# Patient Record
Sex: Male | Born: 1950
Health system: Southern US, Community
[De-identification: ages and names within clinical notes are randomized; demographics above are authoritative.]

## PROBLEM LIST (undated history)

## (undated) DIAGNOSIS — I10 Essential (primary) hypertension: Secondary | ICD-10-CM

---

## 2008-08-09 DIAGNOSIS — I10 Essential (primary) hypertension: Secondary | ICD-10-CM | POA: Insufficient documentation

## 2011-12-04 DEATH — deceased

## 2013-10-04 ENCOUNTER — Encounter (HOSPITAL_COMMUNITY): Payer: Self-pay | Admitting: Emergency Medicine

## 2013-10-04 ENCOUNTER — Emergency Department (HOSPITAL_COMMUNITY)
Admission: EM | Admit: 2013-10-04 | Discharge: 2013-10-04 | Disposition: A | Discharge status: Home / Self Care | Payer: Self-pay | Attending: Emergency Medicine | Admitting: Emergency Medicine

## 2013-10-04 DIAGNOSIS — H6120 Impacted cerumen, unspecified ear: Secondary | ICD-10-CM | POA: Insufficient documentation

## 2013-10-04 NOTE — ED Notes (Signed)
Pt reports having ear pain/popping x 3-4 weeks. Started in left ear and is now in both ears and difficulty hearing.

## 2013-10-04 NOTE — ED Provider Notes (Signed)
Medical screening examination/treatment/procedure(s) were performed by non-physician practitioner and as supervising physician I was immediately available for consultation/collaboration.   EKG Interpretation None       Orlie Dakin, MD 10/04/13 1600

## 2013-10-04 NOTE — Discharge Instructions (Signed)
Follow-up with your primary care physician-- may need to have ears washed every few months to prevent excess wax build up. Return to the ED for new or worsening symptoms.

## 2013-10-04 NOTE — ED Provider Notes (Signed)
CSN: [id number]     Arrival date & time 10/04/13  1253 History  This chart was scribed for non-physician practitioner Quincy Carnes, PA-C working with Orlie Dakin, MD by Ludger Nutting, ED Scribe. This patient was seen in room TR04C/TR04C and the patient's care was started at 2:30 PM.     Chief Complaint  Patient presents with  . Otalgia      The history is provided by the patient. No language interpreter was used.    HPI Comments: Billy Ramirez is a 63 y.o. male who presents to the Emergency Department complaining of 8 days of gradual onset, constant, gradually worsening, bilateral otalgia. He states the pain initially began on the left and progressed to both sides. He reports associated left temple pain that occurs when sleeping on that side. He also has associated muffled hearing. He denies any other symptoms at this time.  No fevers or chills.  No hearing problems at baseline.  History reviewed. No pertinent past medical history. History reviewed. No pertinent past surgical history. History reviewed. No pertinent family history. History  Substance Use Topics  . Smoking status: Not on file  . Smokeless tobacco: Not on file  . Alcohol Use: No    Review of Systems  HENT: Positive for ear pain. Negative for hearing loss.        +muffled hearing  All other systems reviewed and are negative.     Allergies  Review of patient's allergies indicates no known allergies.  Home Medications   Prior to Admission medications   Not on File   BP 145/89  Pulse 93  Temp(Src) 98 F (36.7 C) (Oral)  Resp 18  Wt 206 lb (93.441 kg)  SpO2 96%  Physical Exam  Nursing note and vitals reviewed. Constitutional: He is oriented to person, place, and time. He appears well-developed and well-nourished. No distress.  HENT:  Head: Normocephalic and atraumatic.  Mouth/Throat: Oropharynx is clear and moist.  Bilateral cerumen impaction.   Eyes: Conjunctivae and EOM are normal. Pupils are  equal, round, and reactive to light.  Neck: Normal range of motion. Neck supple.  Cardiovascular: Normal rate, regular rhythm and normal heart sounds.   Pulmonary/Chest: Effort normal and breath sounds normal.  Musculoskeletal: Normal range of motion.  Neurological: He is alert and oriented to person, place, and time.  Skin: Skin is warm and dry. He is not diaphoretic.  Psychiatric: He has a normal mood and affect.    ED Course  EAR CERUMEN REMOVAL Date/Time: 10/04/2013 3:04 PM Performed by: Larene Pickett Authorized by: Larene Pickett Consent: Verbal consent obtained. Risks and benefits: risks, benefits and alternatives were discussed Consent given by: patient Patient understanding: patient states understanding of the procedure being performed Patient identity confirmed: verbally with patient Local anesthetic: none Location details: right ear Procedure type: curette Patient sedated: no Patient tolerance: Patient tolerated the procedure well with no immediate complications.  EAR CERUMEN REMOVAL Date/Time: 10/04/2013 3:05 PM Performed by: Larene Pickett Authorized by: Larene Pickett Consent: Verbal consent obtained. Risks and benefits: risks, benefits and alternatives were discussed Consent given by: patient Patient understanding: patient states understanding of the procedure being performed Patient identity confirmed: verbally with patient Local anesthetic: none Location details: left ear Procedure type: curette Patient tolerance: Patient tolerated the procedure well with no immediate complications.   (including critical care time)  DIAGNOSTIC STUDIES: Oxygen Saturation is 96% on RA, adequate by my interpretation.    COORDINATION OF CARE: 2:31 PM Discussed  treatment plan with pt at bedside and pt agreed to plan.   Labs Review Labs Reviewed - No data to display  Imaging Review No results found.   EKG Interpretation None      MDM   Final diagnoses:  Cerumen  impaction   Bilateral cerumen impaction which was removed in the ED by myself.  TMs were clearly visualized and were without perforation or signs of infection.  Hearing was improved. Pt will FU with PCP if problems occur.  Discussed plan with patient, he/she acknowledged understanding and agreed with plan of care.  Return precautions given for new or worsening symptoms.  I personally performed the services described in this documentation, which was scribed in my presence. The recorded information has been reviewed and is accurate.  Larene Pickett, PA-C 10/04/13 1506

## 2015-04-16 DIAGNOSIS — R030 Elevated blood-pressure reading, without diagnosis of hypertension: Secondary | ICD-10-CM | POA: Diagnosis not present

## 2015-04-16 DIAGNOSIS — J309 Allergic rhinitis, unspecified: Secondary | ICD-10-CM | POA: Diagnosis not present

## 2015-04-16 DIAGNOSIS — Z23 Encounter for immunization: Secondary | ICD-10-CM | POA: Diagnosis not present

## 2015-04-16 DIAGNOSIS — L409 Psoriasis, unspecified: Secondary | ICD-10-CM | POA: Diagnosis not present

## 2015-06-28 DIAGNOSIS — H5210 Myopia, unspecified eye: Secondary | ICD-10-CM | POA: Diagnosis not present

## 2015-06-28 DIAGNOSIS — H52203 Unspecified astigmatism, bilateral: Secondary | ICD-10-CM | POA: Diagnosis not present

## 2015-07-19 DIAGNOSIS — I10 Essential (primary) hypertension: Secondary | ICD-10-CM | POA: Diagnosis not present

## 2015-07-19 DIAGNOSIS — N529 Male erectile dysfunction, unspecified: Secondary | ICD-10-CM | POA: Diagnosis not present

## 2015-07-19 DIAGNOSIS — J309 Allergic rhinitis, unspecified: Secondary | ICD-10-CM | POA: Diagnosis not present

## 2015-07-19 DIAGNOSIS — Z0000 Encounter for general adult medical examination without abnormal findings: Secondary | ICD-10-CM | POA: Diagnosis not present

## 2015-07-19 DIAGNOSIS — L409 Psoriasis, unspecified: Secondary | ICD-10-CM | POA: Diagnosis not present

## 2015-07-19 DIAGNOSIS — F17201 Nicotine dependence, unspecified, in remission: Secondary | ICD-10-CM | POA: Diagnosis not present

## 2015-07-19 DIAGNOSIS — Z125 Encounter for screening for malignant neoplasm of prostate: Secondary | ICD-10-CM | POA: Diagnosis not present

## 2015-07-20 ENCOUNTER — Other Ambulatory Visit: Payer: Self-pay | Admitting: Family Medicine

## 2015-07-20 DIAGNOSIS — F17201 Nicotine dependence, unspecified, in remission: Secondary | ICD-10-CM

## 2015-07-30 ENCOUNTER — Ambulatory Visit: Payer: Self-pay

## 2015-08-02 ENCOUNTER — Ambulatory Visit
Admission: RE | Admit: 2015-08-02 | Discharge: 2015-08-02 | Disposition: A | Discharge status: Home / Self Care | Payer: Commercial Managed Care - HMO | Source: Ambulatory Visit | Attending: Family Medicine | Admitting: Family Medicine

## 2015-08-02 DIAGNOSIS — F17201 Nicotine dependence, unspecified, in remission: Secondary | ICD-10-CM

## 2015-08-02 DIAGNOSIS — Z136 Encounter for screening for cardiovascular disorders: Secondary | ICD-10-CM | POA: Diagnosis not present

## 2015-08-06 DIAGNOSIS — I10 Essential (primary) hypertension: Secondary | ICD-10-CM | POA: Diagnosis not present

## 2015-08-06 DIAGNOSIS — N529 Male erectile dysfunction, unspecified: Secondary | ICD-10-CM | POA: Diagnosis not present

## 2015-09-21 DIAGNOSIS — Z1211 Encounter for screening for malignant neoplasm of colon: Secondary | ICD-10-CM | POA: Diagnosis not present

## 2015-09-21 DIAGNOSIS — D126 Benign neoplasm of colon, unspecified: Secondary | ICD-10-CM | POA: Diagnosis not present

## 2015-09-21 DIAGNOSIS — D125 Benign neoplasm of sigmoid colon: Secondary | ICD-10-CM | POA: Diagnosis not present

## 2015-09-21 DIAGNOSIS — K621 Rectal polyp: Secondary | ICD-10-CM | POA: Diagnosis not present

## 2015-12-14 DIAGNOSIS — I10 Essential (primary) hypertension: Secondary | ICD-10-CM | POA: Diagnosis not present

## 2015-12-14 DIAGNOSIS — H6120 Impacted cerumen, unspecified ear: Secondary | ICD-10-CM | POA: Diagnosis not present

## 2015-12-14 DIAGNOSIS — J309 Allergic rhinitis, unspecified: Secondary | ICD-10-CM | POA: Diagnosis not present

## 2016-06-30 DIAGNOSIS — H52203 Unspecified astigmatism, bilateral: Secondary | ICD-10-CM | POA: Diagnosis not present

## 2016-07-24 DIAGNOSIS — I10 Essential (primary) hypertension: Secondary | ICD-10-CM | POA: Diagnosis not present

## 2016-07-24 DIAGNOSIS — Z0000 Encounter for general adult medical examination without abnormal findings: Secondary | ICD-10-CM | POA: Diagnosis not present

## 2016-07-24 DIAGNOSIS — Z125 Encounter for screening for malignant neoplasm of prostate: Secondary | ICD-10-CM | POA: Diagnosis not present

## 2016-07-24 DIAGNOSIS — L409 Psoriasis, unspecified: Secondary | ICD-10-CM | POA: Diagnosis not present

## 2016-07-24 DIAGNOSIS — Z23 Encounter for immunization: Secondary | ICD-10-CM | POA: Diagnosis not present

## 2016-07-24 DIAGNOSIS — J309 Allergic rhinitis, unspecified: Secondary | ICD-10-CM | POA: Diagnosis not present

## 2016-07-24 DIAGNOSIS — N400 Enlarged prostate without lower urinary tract symptoms: Secondary | ICD-10-CM | POA: Diagnosis not present

## 2016-10-10 DIAGNOSIS — I10 Essential (primary) hypertension: Secondary | ICD-10-CM | POA: Diagnosis not present

## 2017-02-06 DIAGNOSIS — M25579 Pain in unspecified ankle and joints of unspecified foot: Secondary | ICD-10-CM | POA: Diagnosis not present

## 2017-02-06 DIAGNOSIS — Z23 Encounter for immunization: Secondary | ICD-10-CM | POA: Diagnosis not present

## 2017-04-10 DIAGNOSIS — I10 Essential (primary) hypertension: Secondary | ICD-10-CM | POA: Diagnosis not present

## 2017-04-10 DIAGNOSIS — G479 Sleep disorder, unspecified: Secondary | ICD-10-CM | POA: Diagnosis not present

## 2017-04-10 DIAGNOSIS — M109 Gout, unspecified: Secondary | ICD-10-CM | POA: Diagnosis not present

## 2017-05-26 DIAGNOSIS — M549 Dorsalgia, unspecified: Secondary | ICD-10-CM | POA: Diagnosis not present

## 2017-07-10 DIAGNOSIS — H5203 Hypermetropia, bilateral: Secondary | ICD-10-CM | POA: Diagnosis not present

## 2017-10-09 DIAGNOSIS — Z125 Encounter for screening for malignant neoplasm of prostate: Secondary | ICD-10-CM | POA: Diagnosis not present

## 2017-10-09 DIAGNOSIS — J309 Allergic rhinitis, unspecified: Secondary | ICD-10-CM | POA: Diagnosis not present

## 2017-10-09 DIAGNOSIS — L409 Psoriasis, unspecified: Secondary | ICD-10-CM | POA: Diagnosis not present

## 2017-10-09 DIAGNOSIS — I10 Essential (primary) hypertension: Secondary | ICD-10-CM | POA: Diagnosis not present

## 2017-10-09 DIAGNOSIS — Z0000 Encounter for general adult medical examination without abnormal findings: Secondary | ICD-10-CM | POA: Diagnosis not present

## 2017-10-09 DIAGNOSIS — Z1159 Encounter for screening for other viral diseases: Secondary | ICD-10-CM | POA: Diagnosis not present

## 2017-10-09 DIAGNOSIS — N400 Enlarged prostate without lower urinary tract symptoms: Secondary | ICD-10-CM | POA: Diagnosis not present

## 2017-10-09 DIAGNOSIS — Z1389 Encounter for screening for other disorder: Secondary | ICD-10-CM | POA: Diagnosis not present

## 2017-10-23 ENCOUNTER — Emergency Department (HOSPITAL_COMMUNITY)
Admission: EM | Admit: 2017-10-23 | Discharge: 2017-10-24 | Disposition: A | Discharge status: Home / Self Care | Payer: Medicare HMO | Attending: Emergency Medicine | Admitting: Emergency Medicine

## 2017-10-23 ENCOUNTER — Other Ambulatory Visit: Payer: Self-pay

## 2017-10-23 ENCOUNTER — Encounter (HOSPITAL_COMMUNITY): Payer: Self-pay

## 2017-10-23 DIAGNOSIS — I10 Essential (primary) hypertension: Secondary | ICD-10-CM

## 2017-10-23 DIAGNOSIS — R5383 Other fatigue: Secondary | ICD-10-CM | POA: Insufficient documentation

## 2017-10-23 DIAGNOSIS — R531 Weakness: Secondary | ICD-10-CM | POA: Diagnosis not present

## 2017-10-23 DIAGNOSIS — R42 Dizziness and giddiness: Secondary | ICD-10-CM | POA: Diagnosis not present

## 2017-10-23 HISTORY — DX: Essential (primary) hypertension: I10

## 2017-10-23 NOTE — ED Provider Notes (Signed)
[facility] EMERGENCY DEPARTMENT Provider Note   CSN: [id number] Arrival date & time: 10/23/17  2028     History   Chief Complaint Chief Complaint  Patient presents with  . Hypertension    HPI Billy Ramirez is a 67 y.o. male.  Patient with a history of HTN presents with concern for elevated blood pressure today. He reports he has not been feeling well all day today, describing mild persistent nausea, generalized fatigue, "wanting to sleep all day". No chest pain, SOB, DOE, diaphoresis. No history of CAD, CHF. He went to [facility] to check his blood pressure and found it to be significantly elevated prompting ED evaluation. He states he is compliant with his blood pressure medication and takes them once daily in the mornings.  He is feeling better but still a little weak.   The history is provided by the patient and the spouse. No language interpreter was used.  Hypertension  Pertinent negatives include no chest pain, no abdominal pain and no shortness of breath.    Past Medical History:  Diagnosis Date  . Hypertension     There are no active problems to display for this patient.   History reviewed. No pertinent surgical history.      Home Medications    Prior to Admission medications   Not on File    Family History History reviewed. No pertinent family history.  Social History Social History   Tobacco Use  . Smoking status: Never Smoker  Substance Use Topics  . Alcohol use: Yes    Comment: rare  . Drug use: No     Allergies   Patient has no known allergies.   Review of Systems Review of Systems  Constitutional: Positive for fatigue. Negative for chills, diaphoresis and fever.  HENT: Negative.   Respiratory: Negative.  Negative for cough and shortness of breath.   Cardiovascular: Negative.  Negative for chest pain and leg swelling.  Gastrointestinal: Positive for nausea. Negative for abdominal pain and vomiting.  Genitourinary:  Negative.   Musculoskeletal: Negative.   Skin: Negative.   Neurological: Positive for weakness and light-headedness.     Physical Exam Updated Vital Signs BP (!) 172/91 (BP Location: Right Arm)   Pulse 66   Temp 98 F (36.7 C) (Oral)   Resp 16   Ht 6\' 2"  (1.88 m)   Wt 79.4 kg (175 lb)   SpO2 99%   BMI 22.47 kg/m   Physical Exam  Constitutional: He appears well-developed and well-nourished.  HENT:  Head: Normocephalic.  Neck: Normal range of motion. Neck supple. Carotid bruit is not present.  Cardiovascular: Normal rate and regular rhythm.  No murmur heard. Pulmonary/Chest: Effort normal and breath sounds normal. He has no wheezes. He has no rales. He exhibits no tenderness.  Abdominal: Soft. Bowel sounds are normal. There is no tenderness. There is no rebound and no guarding.  Musculoskeletal: Normal range of motion. He exhibits no edema.  Neurological: He is alert. No cranial nerve deficit.  Skin: Skin is warm and dry. No rash noted.  Psychiatric: He has a normal mood and affect.     ED Treatments / Results  Labs (all labs ordered are listed, but only abnormal results are displayed) Labs Reviewed  I-STAT CHEM 8, ED - Abnormal; Notable for the following components:      Result Value   Glucose, Bld 121 (*)    All other components within normal limits  CBC WITH DIFFERENTIAL/PLATELET  I-STAT TROPONIN, ED  EKG None  Radiology No results found.  Procedures Procedures (including critical care time)  Medications Ordered in ED Medications - No data to display   Initial Impression / Assessment and Plan / ED Course  I have reviewed the triage vital signs and the nursing notes.  Pertinent labs & imaging results that were available during my care of the patient were reviewed by me and considered in my medical decision making (see chart for details).     Patient presents with concern for elevated blood pressure, found yesterday after feeling unwell and  fatigued all day, with mild nausea. No CP, SOB at any time. No cough or fever. He reports "having an issue with sinuses" but denies congestion or sore throat. He has not been taking OTC cold remedies.   Labs, EKG are normal. He is ambulatory without symptoms. Blood pressure was better than patient reported until time of discharge and then found to be elevated to 203/120. Still asymptomatic.   Discussed with dr. Jeneen Rinks who advises Hydralazine 10 mg IV, after which the blood pressure improves significantly. He is felt stable for discharge home.   Final Clinical Impressions(s) / ED Diagnoses   Final diagnoses:  None   1. Hypertension   ED Discharge Orders    None       Dennie Bible 10/24/17 0725    Tanna Furry, MD 10/30/17 [phone]

## 2017-10-23 NOTE — ED Triage Notes (Addendum)
Pt endorses check BP earlier and it was 206/113 at the pharmacy. Pt states he had some slight nausea, denies CP or shob. No visual changes. 172/91 in triage. Is on BP medications and took them this morning.

## 2017-10-24 LAB — I-STAT CHEM 8, ED
BUN: 13 mg/dL (ref 6–20)
CALCIUM ION: 1.23 mmol/L (ref 1.15–1.40)
Chloride: 104 mmol/L (ref 101–111)
Creatinine, Ser: 1.2 mg/dL (ref 0.61–1.24)
Glucose, Bld: 121 mg/dL — ABNORMAL HIGH (ref 65–99)
HEMATOCRIT: 43 % (ref 39.0–52.0)
Hemoglobin: 14.6 g/dL (ref 13.0–17.0)
Potassium: 4.3 mmol/L (ref 3.5–5.1)
Sodium: 141 mmol/L (ref 135–145)
TCO2: 25 mmol/L (ref 22–32)

## 2017-10-24 LAB — CBC WITH DIFFERENTIAL/PLATELET
ABS IMMATURE GRANULOCYTES: 0 10*3/uL (ref 0.0–0.1)
Basophils Absolute: 0 10*3/uL (ref 0.0–0.1)
Basophils Relative: 1 %
Eosinophils Absolute: 0.2 10*3/uL (ref 0.0–0.7)
Eosinophils Relative: 3 %
HCT: 43.2 % (ref 39.0–52.0)
Hemoglobin: 13.6 g/dL (ref 13.0–17.0)
IMMATURE GRANULOCYTES: 0 %
Lymphocytes Relative: 38 %
Lymphs Abs: 2.4 10*3/uL (ref 0.7–4.0)
MCH: 29.4 pg (ref 26.0–34.0)
MCHC: 31.5 g/dL (ref 30.0–36.0)
MCV: 93.3 fL (ref 78.0–100.0)
MONOS PCT: 9 %
Monocytes Absolute: 0.6 10*3/uL (ref 0.1–1.0)
NEUTROS ABS: 3.1 10*3/uL (ref 1.7–7.7)
NEUTROS PCT: 49 %
Platelets: 234 10*3/uL (ref 150–400)
RBC: 4.63 MIL/uL (ref 4.22–5.81)
RDW: 12.6 % (ref 11.5–15.5)
WBC: 6.3 10*3/uL (ref 4.0–10.5)

## 2017-10-24 LAB — I-STAT TROPONIN, ED: Troponin i, poc: 0 ng/mL (ref 0.00–0.08)

## 2017-10-24 MED ORDER — HYDRALAZINE HCL 20 MG/ML IJ SOLN
10.0000 mg | Freq: Once | INTRAMUSCULAR | Status: AC
  Administered 2017-10-24: 10 mg via INTRAVENOUS
  Filled 2017-10-24: qty 1

## 2017-10-24 MED ORDER — HYDRALAZINE HCL 20 MG/ML IJ SOLN: 10.0000 mg | Freq: Once | INTRAMUSCULAR | Status: DC

## 2017-10-24 NOTE — ED Notes (Signed)
Pt ambulated around nurses station with steady gait, no complaints

## 2017-10-26 DIAGNOSIS — I10 Essential (primary) hypertension: Secondary | ICD-10-CM | POA: Diagnosis not present

## 2017-12-04 DIAGNOSIS — J069 Acute upper respiratory infection, unspecified: Secondary | ICD-10-CM | POA: Diagnosis not present

## 2017-12-25 DIAGNOSIS — R05 Cough: Secondary | ICD-10-CM | POA: Diagnosis not present

## 2017-12-25 DIAGNOSIS — J069 Acute upper respiratory infection, unspecified: Secondary | ICD-10-CM | POA: Diagnosis not present

## 2018-04-30 DIAGNOSIS — I10 Essential (primary) hypertension: Secondary | ICD-10-CM | POA: Diagnosis not present

## 2018-04-30 DIAGNOSIS — L309 Dermatitis, unspecified: Secondary | ICD-10-CM | POA: Diagnosis not present

## 2018-05-17 DIAGNOSIS — M545 Low back pain: Secondary | ICD-10-CM | POA: Diagnosis not present

## 2018-07-09 DIAGNOSIS — M549 Dorsalgia, unspecified: Secondary | ICD-10-CM | POA: Diagnosis not present

## 2018-07-22 DIAGNOSIS — M6281 Muscle weakness (generalized): Secondary | ICD-10-CM | POA: Diagnosis not present

## 2018-07-22 DIAGNOSIS — M545 Low back pain: Secondary | ICD-10-CM | POA: Diagnosis not present

## 2019-02-25 DIAGNOSIS — Z1389 Encounter for screening for other disorder: Secondary | ICD-10-CM | POA: Diagnosis not present

## 2019-02-25 DIAGNOSIS — I10 Essential (primary) hypertension: Secondary | ICD-10-CM | POA: Diagnosis not present

## 2019-02-25 DIAGNOSIS — M109 Gout, unspecified: Secondary | ICD-10-CM | POA: Diagnosis not present

## 2019-02-25 DIAGNOSIS — G479 Sleep disorder, unspecified: Secondary | ICD-10-CM | POA: Diagnosis not present

## 2019-02-25 DIAGNOSIS — N529 Male erectile dysfunction, unspecified: Secondary | ICD-10-CM | POA: Diagnosis not present

## 2019-02-25 DIAGNOSIS — N400 Enlarged prostate without lower urinary tract symptoms: Secondary | ICD-10-CM | POA: Diagnosis not present

## 2019-02-25 DIAGNOSIS — Z0000 Encounter for general adult medical examination without abnormal findings: Secondary | ICD-10-CM | POA: Diagnosis not present

## 2019-02-25 DIAGNOSIS — Z125 Encounter for screening for malignant neoplasm of prostate: Secondary | ICD-10-CM | POA: Diagnosis not present

## 2019-02-25 DIAGNOSIS — J309 Allergic rhinitis, unspecified: Secondary | ICD-10-CM | POA: Diagnosis not present

## 2019-09-02 DIAGNOSIS — H6120 Impacted cerumen, unspecified ear: Secondary | ICD-10-CM | POA: Diagnosis not present

## 2019-09-02 DIAGNOSIS — N529 Male erectile dysfunction, unspecified: Secondary | ICD-10-CM | POA: Diagnosis not present

## 2019-09-02 DIAGNOSIS — I10 Essential (primary) hypertension: Secondary | ICD-10-CM | POA: Diagnosis not present

## 2019-09-02 DIAGNOSIS — E7800 Pure hypercholesterolemia, unspecified: Secondary | ICD-10-CM | POA: Diagnosis not present

## 2019-09-02 DIAGNOSIS — J309 Allergic rhinitis, unspecified: Secondary | ICD-10-CM | POA: Diagnosis not present

## 2019-10-22 ENCOUNTER — Emergency Department (HOSPITAL_COMMUNITY): Payer: Medicare PPO

## 2019-10-22 ENCOUNTER — Other Ambulatory Visit: Payer: Self-pay

## 2019-10-22 ENCOUNTER — Encounter (HOSPITAL_COMMUNITY): Payer: Self-pay | Admitting: Emergency Medicine

## 2019-10-22 ENCOUNTER — Emergency Department (HOSPITAL_COMMUNITY)
Admission: EM | Admit: 2019-10-22 | Discharge: 2019-10-22 | Disposition: A | Discharge status: Home / Self Care | Payer: Medicare PPO | Attending: Emergency Medicine | Admitting: Emergency Medicine

## 2019-10-22 DIAGNOSIS — R42 Dizziness and giddiness: Secondary | ICD-10-CM | POA: Diagnosis not present

## 2019-10-22 DIAGNOSIS — J189 Pneumonia, unspecified organism: Secondary | ICD-10-CM | POA: Diagnosis not present

## 2019-10-22 DIAGNOSIS — I10 Essential (primary) hypertension: Secondary | ICD-10-CM | POA: Insufficient documentation

## 2019-10-22 DIAGNOSIS — R0602 Shortness of breath: Secondary | ICD-10-CM | POA: Diagnosis not present

## 2019-10-22 DIAGNOSIS — Z79899 Other long term (current) drug therapy: Secondary | ICD-10-CM | POA: Insufficient documentation

## 2019-10-22 DIAGNOSIS — J90 Pleural effusion, not elsewhere classified: Secondary | ICD-10-CM | POA: Diagnosis not present

## 2019-10-22 DIAGNOSIS — I517 Cardiomegaly: Secondary | ICD-10-CM | POA: Diagnosis not present

## 2019-10-22 LAB — URINALYSIS, ROUTINE W REFLEX MICROSCOPIC
Bilirubin Urine: NEGATIVE
Glucose, UA: NEGATIVE mg/dL
Hgb urine dipstick: NEGATIVE
Ketones, ur: NEGATIVE mg/dL
Leukocytes,Ua: NEGATIVE
Nitrite: NEGATIVE
Protein, ur: NEGATIVE mg/dL
Specific Gravity, Urine: 1.014 (ref 1.005–1.030)
pH: 7 (ref 5.0–8.0)

## 2019-10-22 LAB — CBC
HCT: 43.8 % (ref 39.0–52.0)
Hemoglobin: 13.8 g/dL (ref 13.0–17.0)
MCH: 29.7 pg (ref 26.0–34.0)
MCHC: 31.5 g/dL (ref 30.0–36.0)
MCV: 94.4 fL (ref 80.0–100.0)
Platelets: 230 10*3/uL (ref 150–400)
RBC: 4.64 MIL/uL (ref 4.22–5.81)
RDW: 12.8 % (ref 11.5–15.5)
WBC: 6.2 10*3/uL (ref 4.0–10.5)
nRBC: 0 % (ref 0.0–0.2)

## 2019-10-22 LAB — CBG MONITORING, ED: Glucose-Capillary: 136 mg/dL — ABNORMAL HIGH (ref 70–99)

## 2019-10-22 LAB — BASIC METABOLIC PANEL
Anion gap: 6 (ref 5–15)
BUN: 11 mg/dL (ref 8–23)
CO2: 23 mmol/L (ref 22–32)
Calcium: 9 mg/dL (ref 8.9–10.3)
Chloride: 109 mmol/L (ref 98–111)
Creatinine, Ser: 1.06 mg/dL (ref 0.61–1.24)
GFR calc Af Amer: >60 mL/min (ref >60)
GFR calc non Af Amer: >60 mL/min (ref >60)
Glucose, Bld: 159 mg/dL — ABNORMAL HIGH (ref 70–99)
Potassium: 4.1 mmol/L (ref 3.5–5.1)
Sodium: 138 mmol/L (ref 135–145)

## 2019-10-22 LAB — BRAIN NATRIURETIC PEPTIDE: B Natriuretic Peptide: 26.2 pg/mL (ref 0.0–100.0)

## 2019-10-22 LAB — TROPONIN I (HIGH SENSITIVITY)
Troponin I (High Sensitivity): 10 ng/L (ref <18)
Troponin I (High Sensitivity): 4 ng/L (ref <18)

## 2019-10-22 LAB — D-DIMER, QUANTITATIVE: D-Dimer, Quant: 0.78 ug/mL-FEU — ABNORMAL HIGH (ref 0.00–0.50)

## 2019-10-22 MED ORDER — DOXYCYCLINE HYCLATE 100 MG PO CAPS
100.0000 mg | ORAL_CAPSULE | Freq: Two times a day (BID) | ORAL | 0 refills | Status: AC
Start: 2019-10-22 — End: 2019-10-29

## 2019-10-22 MED ORDER — SODIUM CHLORIDE 0.9% FLUSH
3.0000 mL | Freq: Once | INTRAVENOUS | Status: AC
  Administered 2019-10-22: 3 mL via INTRAVENOUS

## 2019-10-22 MED ORDER — IOHEXOL 350 MG/ML SOLN
75.0000 mL | Freq: Once | INTRAVENOUS | Status: AC | PRN
  Administered 2019-10-22: 75 mL via INTRAVENOUS

## 2019-10-22 NOTE — ED Triage Notes (Signed)
Pt. Stated, I started having some dizziness or light headed yesterday, today more dizzy.

## 2019-10-22 NOTE — ED Provider Notes (Signed)
[facility] EMERGENCY DEPARTMENT Provider Note   CSN: [id number] Arrival date & time: 10/22/19  8127     History Chief Complaint  Patient presents with  . Dizziness  . Shortness of Breath    Billy Ramirez is a 69 y.o. male with pertinent past medical history of hypertension that presents emergency department today for dizziness and shortness of breath.  Patient states that he felt slightly dizzy yesterday, describes it as a woozy feeling as if he was hung over and then the dizziness progressed and went away, states that it only lasted about half an hour.  States that today it became extremely worse, states that it was severe and immediately started this morning.  States that it still feels like he is hung over, states that it is  worse when he is walking and when he is laying down.  Admits it to feeling like lightheadedness, no  room is spinning.  Denies any headache or vision changes.  Denies any nausea or vomiting.  Also admits to shortness of breath that started this morning when the dizziness started.  States that the shortness of breath is only exertional.  Has never felt anything like this before.  Denies any cardiac or lung history.  Does not smoke.  Denies any recent long travel, recent surgery, clotting disorder, calf tenderness or swelling.  Patient states that he was in normal health before this started.  States that he has hypertension as his only medical diagnosis.  States that he is pretty active and gets a lot of exercise at work.  Also admits to some right hand numbness and tingling in his fingers, states that this also occurred when the dizziness started yesterday.  Denies any chest pain.  Denies any back pain.  Denies any URI-like symptoms.  No fever or chills.  HPI     Past Medical History:  Diagnosis Date  . Hypertension     There are no problems to display for this patient.   History reviewed. No pertinent surgical history.     No family  history on file.  Social History   Tobacco Use  . Smoking status: Never Smoker  . Smokeless tobacco: Never Used  Substance Use Topics  . Alcohol use: Yes    Comment: rare  . Drug use: No    Home Medications Prior to Admission medications   Medication Sig Start Date End Date Taking? Authorizing Provider  atorvastatin (LIPITOR) 20 MG tablet Take 20 mg by mouth daily.   Yes [provider]  lisinopril (ZESTRIL) 20 MG tablet Take 20 mg by mouth daily.   Yes [provider]  doxycycline (VIBRAMYCIN) 100 MG capsule Take 1 capsule (100 mg total) by mouth 2 (two) times daily for 7 days. 10/22/19 10/29/19  Alfredia Client, PA-C    Allergies    Patient has no known allergies.  Review of Systems   Review of Systems  Constitutional: Negative for chills, diaphoresis, fatigue and fever.  HENT: Negative for congestion, sore throat and trouble swallowing.   Eyes: Negative for pain and visual disturbance.  Respiratory: Positive for shortness of breath. Negative for apnea, cough, choking, chest tightness and wheezing.   Cardiovascular: Negative for chest pain, palpitations and leg swelling.  Gastrointestinal: Negative for abdominal distention, abdominal pain, diarrhea, nausea and vomiting.  Genitourinary: Negative for difficulty urinating.  Musculoskeletal: Negative for back pain, neck pain and neck stiffness.  Skin: Negative for pallor.  Neurological: Positive for dizziness and numbness (Right fingers). Negative  for tremors, seizures, syncope, facial asymmetry, speech difficulty, weakness, light-headedness and headaches.  Psychiatric/Behavioral: Negative for confusion.    Physical Exam Updated Vital Signs BP 138/90 (BP Location: Left Arm)   Pulse (!) 118   Temp 98.2 F (36.8 C) (Oral)   Resp 16   Ht 6\' 2"  (1.88 m)   Wt 124.7 kg   SpO2 100%   BMI 35.31 kg/m   Physical Exam Constitutional:      General: He is not in acute distress.    Appearance: Normal appearance.  He is not ill-appearing, toxic-appearing or diaphoretic.  HENT:     Mouth/Throat:     Mouth: Mucous membranes are moist.     Pharynx: Oropharynx is clear.  Eyes:     General: No scleral icterus.    Extraocular Movements: Extraocular movements intact.     Pupils: Pupils are equal, round, and reactive to light.  Cardiovascular:     Rate and Rhythm: Normal rate and regular rhythm.     Pulses: Normal pulses.     Heart sounds: Normal heart sounds.  Pulmonary:     Effort: Pulmonary effort is normal. No respiratory distress.     Breath sounds: Normal breath sounds. No stridor. No wheezing, rhonchi or rales.  Chest:     Chest wall: No tenderness.  Abdominal:     General: Abdomen is flat. There is no distension.     Palpations: Abdomen is soft.     Tenderness: There is no abdominal tenderness. There is no guarding or rebound.  Musculoskeletal:        General: No swelling or tenderness. Normal range of motion.     Cervical back: Normal range of motion and neck supple. No rigidity.     Right lower leg: No edema.     Left lower leg: No edema.  Skin:    General: Skin is warm and dry.     Capillary Refill: Capillary refill takes less than 2 seconds.     Coloration: Skin is not pale.  Neurological:     General: No focal deficit present.     Mental Status: He is alert and oriented to person, place, and time.     Comments: Alert and oriented times 3. Clear speech. No facial droop. CNIII-XII grossly intact. Bilateral upper and lower extremities' sensation grossly intact. 5/5 symmetric strength with grip strength and with plantar and dorsi flexion bilaterally. Normal finger to nose bilaterally. Negative pronator drift.  Patient with normal sensation to right and and left upper extremity with normal strength. Gait is normal and denied dizziness when accessing gait.    Psychiatric:        Mood and Affect: Mood normal.        Behavior: Behavior normal.     ED Results / Procedures / Treatments     Labs (all labs ordered are listed, but only abnormal results are displayed) Labs Reviewed  BASIC METABOLIC PANEL - Abnormal; Notable for the following components:      Result Value   Glucose, Bld 159 (*)    All other components within normal limits  D-DIMER, QUANTITATIVE (NOT AT [facility]) - Abnormal; Notable for the following components:   D-Dimer, Quant 0.78 (*)    All other components within normal limits  CBG MONITORING, ED - Abnormal; Notable for the following components:   Glucose-Capillary 136 (*)    All other components within normal limits  CBC  URINALYSIS, ROUTINE W REFLEX MICROSCOPIC  BRAIN NATRIURETIC PEPTIDE  TROPONIN I (  HIGH SENSITIVITY)  TROPONIN I (HIGH SENSITIVITY)    EKG None  Radiology CT Head Wo Contrast  Result Date: 10/22/2019 CLINICAL DATA:  69 year old male with a history of dizziness and lightheadedness EXAM: CT HEAD WITHOUT CONTRAST TECHNIQUE: Contiguous axial images were obtained from the base of the skull through the vertex without intravenous contrast. COMPARISON:  None. FINDINGS: Brain: No acute intracranial hemorrhage. No midline shift or mass effect. Gray-white differentiation maintained. Unremarkable appearance of the ventricular system. Patchy hypodensity in the bilateral periventricular white matter. Vascular: Intracranial atherosclerosis. Skull: No acute fracture.  No aggressive bone lesion identified. Sinuses/Orbits: Unremarkable appearance of the orbits. Mastoid air cells clear. No middle ear effusion. No significant sinus disease. Other: None IMPRESSION: Negative for acute intracranial abnormality. Chronic microvascular ischemic disease and intracranial atherosclerosis. Electronically Signed   By: Corrie Mckusick D.O.   On: 10/22/2019 10:05   CT Angio Chest PE W/Cm &/Or Wo Cm  Result Date: 10/22/2019 CLINICAL DATA:  Shortness of breath, elevated D-dimer EXAM: CT ANGIOGRAPHY CHEST WITH CONTRAST TECHNIQUE: Multidetector CT imaging of the chest was  performed using the standard protocol during bolus administration of intravenous contrast. Multiplanar CT image reconstructions and MIPs were obtained to evaluate the vascular anatomy. CONTRAST:  18mL OMNIPAQUE IOHEXOL 350 MG/ML SOLN COMPARISON:  None. FINDINGS: Cardiovascular: Heart size normal. No pericardial effusion. The RV is nondilated. Satisfactory opacification of pulmonary arteries noted, and there is no evidence of pulmonary emboli. Incomplete opacification of the thoracic aorta. No aneurysm. No suggestion of dissection or stenosis. Minimal calcified atheromatous plaque. Mediastinum/Nodes: No hilar or mediastinal adenopathy. No mediastinal hematoma. Lungs/Pleura: No effusion. No pneumothorax. 2.8 x 7.5 cm pleural-based masslike consolidation inferiorly in the right lower lobe, with a focal coarse calcification inferiorly suggesting pleural plaque. Linear scarring or atelectasis in the right middle lobe. Upper Abdomen: Bilateral nephrolithiasis without hydronephrosis. No acute findings. Musculoskeletal: Spondylitic changes in the lower cervical spine. Anterior vertebral endplate spurring at multiple levels in the lower thoracic spine. Review of the MIP images confirms the above findings. IMPRESSION: 1. Negative for acute PE. 2. 7.5 cm pleural-based masslike consolidation inferiorly in the right lower lobe, possibly pneumonia versus atelectasis and associated pleural plaque, or neoplasm. In the absence of prior studies, consider three-month follow-up CT to confirm appropriate resolution. 3. Bilateral nephrolithiasis without hydronephrosis. Aortic Atherosclerosis (ICD10-I70.0). Electronically Signed   By: Lucrezia Europe M.D.   On: 10/22/2019 11:55   DG Chest Portable 1 View  Result Date: 10/22/2019 CLINICAL DATA:  Dizziness and lethargy.  Shortness of breath. EXAM: PORTABLE CHEST 1 VIEW COMPARISON:  None. FINDINGS: Lungs are adequately inflated without focal airspace consolidation or effusion. Borderline  cardiomegaly. Degenerative change of the spine. IMPRESSION: No active disease. Electronically Signed   By: Marin Olp M.D.   On: 10/22/2019 10:28    Procedures Procedures (including critical care time)  Medications Ordered in ED Medications  sodium chloride flush (NS) 0.9 % injection 3 mL (3 mLs Intravenous Given 10/22/19 0949)  iohexol (OMNIPAQUE) 350 MG/ML injection 75 mL (75 mLs Intravenous Contrast Given 10/22/19 1104)    ED Course  I have reviewed the triage vital signs and the nursing notes.  Pertinent labs & imaging results that were available during my care of the patient were reviewed by me and considered in my medical decision making (see chart for details).  Clinical Course as of Oct 22 1746  Sat Oct 22, 2019  1442 Leukocytes,Ua: NEGATIVE [SP]    Clinical Course User Index [SP] Alfredia Client, PA-C  MDM Rules/Calculators/A&P                          Billy Ramirez is a 69 y.o. male with pertinent past medical history of hypertension that presents emergency department today for dizziness and shortness of breath.  Dizziness does not appear to be neurologic in origin, no nystagmus or vision changes or headache.  Patient states that dizziness only occurs when he stands, does not describe dizziness as vertigo-like symptoms. CT head negative.normal neuro exam, patient without any sensation or weakness changes in upper extremity. Normal left hand sensation.  In regards to shortness of breath, states that his only upon walking.  Has never had anything like this before.  Denies any chest pain.  Is not a smoker.  Will obtain a D-dimer and reassess. Patient did not take blood pressure medication this morning.  ECG interpreted by me demonstrated no sings of ischemia.   CXR interpreted by me demonstrated no acute cardiopulmonary disease.  Labs demonstrated stable CBC and CMP.  D-dimer slightly elevated 0.78, will obtain CT angiogram at this time.  First troponin 10.  Second troponin  4.  CTA with signs of consolidation on RLL PNA vs mass vs atelectasis, willl treat for PNA and have patient repeat CT in 3 months, did discuss this in depth with patient.  Upon reassessment patient is not complaining of any pain I was able to ambulate patient, patient did not complain of any dizziness. Patient states that he feels slightly better than when upon arriving.  Given the above findings, my suspicion is that patient most likely has pneumonia due to lung findings associated with some shortness of breath.  I will have patient follow-up with dizziness, CT head is negative.Negative orthostatics, pt does not seem dry. I expressed that the next steps would be to get an MRI outpatient if he still does not feel better, I expressed that he needs to stay hydrated.  Shared decision making about meclizine, patient denies at this time.   .Doubt need for further emergent work up at this time. I explained the diagnosis and have given explicit precautions to return to the ER including for any other new or worsening symptoms. The patient understands and accepts the medical plan as it's been dictated and I have answered their questions. Discharge instructions concerning home care and prescriptions have been given. The patient is STABLE and is discharged to home in good condition. Expressed that patient needs to follow up with PCP about PNA, lung consolidation, dizziness, and HTN in ER today. Pt did not take HTN meds, his BP went back to normal before discharge without intervention. PT agreeable.   I discussed this case with my attending physician who cosigned this note including patient's presenting symptoms, physical exam, and planned diagnostics and interventions. Attending physician stated agreement with plan or made changes to plan which were implemented.   Attending physician assessed patient at bedside.  Final Clinical Impression(s) / ED Diagnoses Final diagnoses:  Dizziness  Community acquired  pneumonia of right lower lobe of lung    Rx / DC Orders ED Discharge Orders         Ordered    doxycycline (VIBRAMYCIN) 100 MG capsule  2 times daily     Discontinue  Reprint     10/22/19 1441           Alfredia Client, PA-C 10/22/19 1754    Little, Wenda Overland, MD 10/23/19 [phone]

## 2019-10-22 NOTE — Discharge Instructions (Addendum)
You were seen today for shortness of breath, this is most likely due to the pneumonia as we discussed. However I want you to follow-up on your CT scan in the next 3 months. This is because your CT showed a nodule on your RIGHT lower lobe that was undetermined. I want you to talk to your primary care about your dizziness along with your high blood pressure, you could be having dizziness due to this. I want you to use the 2 guides attached. I want you to take your doxycycline for the next 7 days, make sure you eat and drink with this. I want you to stay hydrated and if you are out in the sun make sure to stay protected. Come back to the emergency department if you have new or worsening concerning symptoms. Come back if you have any chest pain, severe headache, vision changes, weakness. He also had elevated blood pressure in the emergency department, I want you to follow-up with your PCP about this as well.

## 2019-11-25 ENCOUNTER — Ambulatory Visit
Admission: RE | Admit: 2019-11-25 | Discharge: 2019-11-25 | Disposition: A | Discharge status: Home / Self Care | Payer: Medicare PPO | Source: Ambulatory Visit | Attending: Family Medicine | Admitting: Family Medicine

## 2019-11-25 ENCOUNTER — Other Ambulatory Visit: Payer: Self-pay | Admitting: Family Medicine

## 2019-11-25 DIAGNOSIS — J181 Lobar pneumonia, unspecified organism: Secondary | ICD-10-CM | POA: Diagnosis not present

## 2019-11-25 DIAGNOSIS — R918 Other nonspecific abnormal finding of lung field: Secondary | ICD-10-CM | POA: Diagnosis not present

## 2019-11-25 DIAGNOSIS — R9389 Abnormal findings on diagnostic imaging of other specified body structures: Secondary | ICD-10-CM

## 2019-11-25 DIAGNOSIS — J9811 Atelectasis: Secondary | ICD-10-CM | POA: Diagnosis not present

## 2019-11-25 DIAGNOSIS — I517 Cardiomegaly: Secondary | ICD-10-CM | POA: Diagnosis not present

## 2020-01-13 ENCOUNTER — Other Ambulatory Visit: Payer: Self-pay | Admitting: Family Medicine

## 2020-01-13 DIAGNOSIS — G5600 Carpal tunnel syndrome, unspecified upper limb: Secondary | ICD-10-CM | POA: Diagnosis not present

## 2020-01-13 DIAGNOSIS — R9389 Abnormal findings on diagnostic imaging of other specified body structures: Secondary | ICD-10-CM | POA: Diagnosis not present

## 2020-01-13 DIAGNOSIS — R05 Cough: Secondary | ICD-10-CM | POA: Diagnosis not present

## 2020-01-13 DIAGNOSIS — I10 Essential (primary) hypertension: Secondary | ICD-10-CM | POA: Diagnosis not present

## 2020-01-27 ENCOUNTER — Ambulatory Visit
Admission: RE | Admit: 2020-01-27 | Discharge: 2020-01-27 | Disposition: A | Discharge status: Home / Self Care | Payer: Medicare PPO | Source: Ambulatory Visit | Attending: Family Medicine | Admitting: Family Medicine

## 2020-01-27 DIAGNOSIS — R918 Other nonspecific abnormal finding of lung field: Secondary | ICD-10-CM | POA: Diagnosis not present

## 2020-01-27 DIAGNOSIS — J9811 Atelectasis: Secondary | ICD-10-CM | POA: Diagnosis not present

## 2020-01-27 DIAGNOSIS — R9389 Abnormal findings on diagnostic imaging of other specified body structures: Secondary | ICD-10-CM

## 2020-01-27 DIAGNOSIS — I700 Atherosclerosis of aorta: Secondary | ICD-10-CM | POA: Diagnosis not present

## 2020-01-27 DIAGNOSIS — M47814 Spondylosis without myelopathy or radiculopathy, thoracic region: Secondary | ICD-10-CM | POA: Diagnosis not present

## 2020-01-27 MED ORDER — IOPAMIDOL (ISOVUE-300) INJECTION 61%
75.0000 mL | Freq: Once | INTRAVENOUS | Status: AC | PRN
  Administered 2020-01-27: 75 mL via INTRAVENOUS

## 2020-02-10 DIAGNOSIS — E041 Nontoxic single thyroid nodule: Secondary | ICD-10-CM | POA: Diagnosis not present

## 2020-02-15 ENCOUNTER — Other Ambulatory Visit (HOSPITAL_COMMUNITY): Payer: Self-pay | Admitting: Family Medicine

## 2020-02-15 ENCOUNTER — Other Ambulatory Visit: Payer: Self-pay | Admitting: Family Medicine

## 2020-02-15 DIAGNOSIS — R911 Solitary pulmonary nodule: Secondary | ICD-10-CM

## 2020-02-15 DIAGNOSIS — R9389 Abnormal findings on diagnostic imaging of other specified body structures: Secondary | ICD-10-CM

## 2020-02-20 ENCOUNTER — Other Ambulatory Visit: Payer: Self-pay

## 2020-02-20 ENCOUNTER — Ambulatory Visit: Payer: Medicare PPO | Admitting: Physician Assistant

## 2020-02-20 VITALS — BP 183/104 | HR 62 | Temp 98.2°F | Resp 18 | Ht 74.0 in | Wt 271.0 lb

## 2020-02-20 DIAGNOSIS — I10 Essential (primary) hypertension: Secondary | ICD-10-CM

## 2020-02-20 DIAGNOSIS — K047 Periapical abscess without sinus: Secondary | ICD-10-CM

## 2020-02-20 MED ORDER — ACETAMINOPHEN-CODEINE #3 300-30 MG PO TABS: 1.0000 | ORAL_TABLET | ORAL | 0 refills | Status: AC | PRN

## 2020-02-20 MED ORDER — AMOXICILLIN 500 MG PO CAPS: 500.0000 mg | ORAL_CAPSULE | Freq: Three times a day (TID) | ORAL | 0 refills | Status: AC

## 2020-02-20 NOTE — Progress Notes (Signed)
New Patient Office Visit  Subjective:  Patient ID: Billy Ramirez, male    DOB: 08/02/1950  Age: 69 y.o. MRN: [id number]  CC:  Chief Complaint  Patient presents with  . Dental Pain    left molar    HPI Billy Ramirez reports that he started having tooth pain left side molar, started Sunday morning.  States that he has tried OTC ibuprofen without relief.  Denies discharge, foul taste, endorses swelling.  States that he is having difficulty eating and drinking.  States that his BP is well controlled at home, denies any hypertensive sxs.   Past Medical History:  Diagnosis Date  . Hypertension     History reviewed. No pertinent surgical history.  History reviewed. No pertinent family history.  Social History   Socioeconomic History  . Marital status: Single    Spouse name: Not on file  . Number of children: Not on file  . Years of education: Not on file  . Highest education level: Not on file  Occupational History  . Not on file  Tobacco Use  . Smoking status: Never Smoker  . Smokeless tobacco: Never Used  Substance and Sexual Activity  . Alcohol use: Yes    Comment: rare  . Drug use: No  . Sexual activity: Not on file  Other Topics Concern  . Not on file  Social History Narrative  . Not on file   Social Determinants of Health   Financial Resource Strain:   . Difficulty of Paying Living Expenses: Not on file  Food Insecurity:   . Worried About Charity fundraiser in the Last Year: Not on file  . Ran Out of Food in the Last Year: Not on file  Transportation Needs:   . Lack of Transportation (Medical): Not on file  . Lack of Transportation (Non-Medical): Not on file  Physical Activity:   . Days of Exercise per Week: Not on file  . Minutes of Exercise per Session: Not on file  Stress:   . Feeling of Stress : Not on file  Social Connections:   . Frequency of Communication with Friends and Family: Not on file  . Frequency of Social Gatherings with  Friends and Family: Not on file  . Attends Religious Services: Not on file  . Active Member of Clubs or Organizations: Not on file  . Attends Archivist Meetings: Not on file  . Marital Status: Not on file  Intimate Partner Violence:   . Fear of Current or Ex-Partner: Not on file  . Emotionally Abused: Not on file  . Physically Abused: Not on file  . Sexually Abused: Not on file    ROS Review of Systems  Constitutional: Negative for chills, fatigue and fever.  HENT: Positive for dental problem.   Eyes: Negative.   Respiratory: Negative.   Cardiovascular: Negative for chest pain and palpitations.  Gastrointestinal: Negative.   Endocrine: Negative.   Genitourinary: Negative.   Musculoskeletal: Negative.   Skin: Negative.   Allergic/Immunologic: Negative.   Neurological: Negative for dizziness, syncope and headaches.  Hematological: Negative.   Psychiatric/Behavioral: Negative.     Objective:   Today's Vitals: BP (!) 183/104 (BP Location: Left Arm, Patient Position: Sitting, Cuff Size: Large)   Pulse 62   Temp 98.2 F (36.8 C) (Oral)   Resp 18   Ht 6\' 2"  (1.88 m)   Wt 271 lb (122.9 kg)   SpO2 98%   BMI 34.79 kg/m   Physical Exam Vitals and  nursing note reviewed.  Constitutional:      General: He is not in acute distress.    Appearance: Normal appearance. He is not ill-appearing.  HENT:     Head: Normocephalic and atraumatic.     Right Ear: External ear normal.     Left Ear: External ear normal.     Nose: Nose normal.     Mouth/Throat:     Mouth: Mucous membranes are moist.  Eyes:     Extraocular Movements: Extraocular movements intact.     Conjunctiva/sclera: Conjunctivae normal.     Pupils: Pupils are equal, round, and reactive to light.  Cardiovascular:     Rate and Rhythm: Normal rate and regular rhythm.     Pulses: Normal pulses.     Heart sounds: Normal heart sounds.  Pulmonary:     Effort: Pulmonary effort is normal.     Breath sounds:  Normal breath sounds.  Musculoskeletal:        General: Normal range of motion.     Cervical back: Normal range of motion and neck supple.  Skin:    General: Skin is warm and dry.  Neurological:     General: No focal deficit present.     Mental Status: He is alert and oriented to person, place, and time.  Psychiatric:        Mood and Affect: Mood normal.        Behavior: Behavior normal.        Thought Content: Thought content normal.        Judgment: Judgment normal.          Dental Infection    Assessment & Plan:   Problem List Items Addressed This Visit      Cardiovascular and Mediastinum   Essential hypertension    Other Visit Diagnoses    Dental infection    -  Primary   Relevant Medications   acetaminophen-codeine (TYLENOL #3) 300-30 MG tablet   amoxicillin (AMOXIL) 500 MG capsule      Outpatient Encounter Medications as of 02/20/2020  Medication Sig  . acetaminophen-codeine (TYLENOL #3) 300-30 MG tablet Take 1 tablet by mouth every 4 (four) hours as needed for up to 7 days for moderate pain.  Marland Kitchen amoxicillin (AMOXIL) 500 MG capsule Take 1 capsule (500 mg total) by mouth 3 (three) times daily.  Marland Kitchen atorvastatin (LIPITOR) 20 MG tablet Take 20 mg by mouth daily.  Marland Kitchen lisinopril (ZESTRIL) 20 MG tablet Take 20 mg by mouth daily.   No facility-administered encounter medications on file as of 02/20/2020.   1. Dental infection Patient encouraged to reach out to dentistry asap, continue OTC ibuprofen for pain, use Tylenol-3 for severe pain - acetaminophen-codeine (TYLENOL #3) 300-30 MG tablet; Take 1 tablet by mouth every 4 (four) hours as needed for up to 7 days for moderate pain.  Dispense: 30 tablet; Refill: 0 - amoxicillin (AMOXIL) 500 MG capsule; Take 1 capsule (500 mg total) by mouth 3 (three) times daily.  Dispense: 30 capsule; Refill: 0  2. Essential hypertension The patient was given clear instructions to go to ER or return to medical center if symptoms don't  improve, worsen or new problems develop.   I have reviewed the patient's medical history (PMH, PSH, Social History, Family History, Medications, and allergies) , and have been updated if relevant. I spent 30 minutes reviewing chart and  face to face time with patient.     Follow-up: Return if symptoms worsen or fail to improve.  Loraine Grip Mayers, PA-C

## 2020-02-20 NOTE — Patient Instructions (Signed)
I encourage you to call dentistry asap to make an appointment for further evaluation.  You will take amoxicillin 3 times a day for 10 days, you can use over the counter pain relievers as directed, and tylenol-3 for severe pain  I hope that you feel better soon  Kennieth Rad, PA-C Physician Assistant [facility] [email or web address]    Dental Abscess  A dental abscess is a collection of pus in or around a tooth that results from an infection. An abscess can cause pain in the affected area as well as other symptoms. Treatment is important to help with symptoms and to prevent the infection from spreading. What are the causes? This condition is caused by a bacterial infection around the root of the tooth that involves the inner part of the tooth (pulp). It may result from:  Severe tooth decay.  Trauma to the tooth, such as a broken or chipped tooth, that allows bacteria to enter into the pulp.  Severe gum disease around a tooth. What increases the risk? This condition is more likely to develop in males. It is also more likely to develop in people who:  Have dental decay (cavities).  Eat sugary snacks between meals.  Use tobacco products.  Have diabetes.  Have a weakened disease-fighting system (immune system).  Do not brush and care for their teeth regularly. What are the signs or symptoms? Symptoms of this condition include:  Severe pain in and around the infected tooth.  Swelling and redness around the infected tooth, in the mouth, or in the face.  Tenderness.  Pus drainage.  Bad breath.  Bitter taste in the mouth.  Difficulty swallowing.  Difficulty opening the mouth.  Nausea.  Vomiting.  Chills.  Swollen neck glands.  Fever. How is this diagnosed? This condition is diagnosed based on:  Your symptoms and your medical and dental history.  An examination of the infected tooth. During the exam,  your dentist may tap on the infected tooth. You may also have X-rays of the affected area. How is this treated? This condition is treated by getting rid of the infection. This may be done with:  Incision and drainage. This procedure is done by making an incision in the abscess to drain out the pus. Removing pus is the first priority in treating an abscess.  Antibiotic medicines. These may be used in certain situations.  Antibacterial mouth rinse.  A root canal. This may be performed to save the tooth. Your dentist accesses the visible part of your tooth (crown) with a drill and removes any damaged pulp. Then the space is filled and sealed off.  Tooth extraction. The tooth is pulled out if it cannot be saved by other treatment. You may also receive treatment for pain, such as:  Acetaminophen or NSAIDs.  Gels that contain a numbing medicine.  An injection to block the pain near your nerve. Follow these instructions at home: Medicines  Take over-the-counter and prescription medicines only as told by your dentist.  If you were prescribed an antibiotic, take it as told by your dentist. Do not stop taking the antibiotic even if you start to feel better.  If you were prescribed a gel that contains a numbing medicine, use it exactly as told in the directions. Do not use these gels for children who are younger than 5 years of age.  Do not drive or use heavy machinery while taking prescription pain medicine. General instructions  Rinse out your mouth often with salt  water to relieve pain or swelling. To make a salt-water mixture, completely dissolve -1 tsp of salt in 1 cup of warm water.  Eat a soft diet while your abscess is healing.  Drink enough fluid to keep your urine pale yellow.  Do not apply heat to the outside of your mouth.  Do not use any products that contain nicotine or tobacco, such as cigarettes and e-cigarettes. If you need help quitting, ask your health care  provider.  Keep all follow-up visits as told by your dentist. This is important. How is this prevented?  Brush your teeth every morning and night with fluoride toothpaste. Floss one time each day.  Get regularly scheduled dental cleanings.  Consider having a dental sealant applied on teeth that have deep holes (caries).  Drink fluoridated water regularly. This includes most tap water. Check the label on bottled water to see if it contains fluoride.  Drink water instead of sugary drinks.  Eat healthy meals and snacks.  Wear a mouth guard or face shield to protect your teeth while playing sports. Contact a health care provider if:  Your pain is worse and is not helped by medicine. Get help right away if:  You have a fever or chills.  Your symptoms suddenly get worse.  You have a very bad headache.  You have problems breathing or swallowing.  You have trouble opening your mouth.  You have swelling in your neck or around your eye. Summary  A dental abscess is a collection of pus in or around a tooth that results from an infection.  A dental abscess may result from severe tooth decay, trauma to the tooth, or severe gum disease around a tooth.  Symptoms include severe pain, swelling, redness, and drainage of pus in and around the infected tooth.  The first priority in treating a dental abscess is to drain out the pus. Treatment may also involve removing damage inside the tooth (root canal) or pulling out (extracting) the tooth. This information is not intended to replace advice given to you by your health care provider. Make sure you discuss any questions you have with your health care provider. Document Revised: 04/03/2017 Document Reviewed: 12/22/2016 Elsevier Patient Education  2020 [employer].

## 2020-03-08 ENCOUNTER — Ambulatory Visit (HOSPITAL_COMMUNITY)
Admission: RE | Admit: 2020-03-08 | Discharge: 2020-03-08 | Disposition: A | Discharge status: Home / Self Care | Payer: Medicare PPO | Source: Ambulatory Visit | Attending: Family Medicine | Admitting: Family Medicine

## 2020-03-08 ENCOUNTER — Other Ambulatory Visit: Payer: Self-pay

## 2020-03-08 DIAGNOSIS — R911 Solitary pulmonary nodule: Secondary | ICD-10-CM | POA: Diagnosis not present

## 2020-03-08 DIAGNOSIS — R9389 Abnormal findings on diagnostic imaging of other specified body structures: Secondary | ICD-10-CM | POA: Diagnosis not present

## 2020-03-08 DIAGNOSIS — I700 Atherosclerosis of aorta: Secondary | ICD-10-CM | POA: Diagnosis not present

## 2020-03-08 DIAGNOSIS — I517 Cardiomegaly: Secondary | ICD-10-CM | POA: Diagnosis not present

## 2020-03-08 DIAGNOSIS — K4090 Unilateral inguinal hernia, without obstruction or gangrene, not specified as recurrent: Secondary | ICD-10-CM | POA: Diagnosis not present

## 2020-03-08 DIAGNOSIS — N200 Calculus of kidney: Secondary | ICD-10-CM | POA: Insufficient documentation

## 2020-03-08 DIAGNOSIS — Z79899 Other long term (current) drug therapy: Secondary | ICD-10-CM | POA: Diagnosis not present

## 2020-03-08 DIAGNOSIS — K5730 Diverticulosis of large intestine without perforation or abscess without bleeding: Secondary | ICD-10-CM | POA: Diagnosis not present

## 2020-03-08 LAB — GLUCOSE, CAPILLARY: Glucose-Capillary: 133 mg/dL — ABNORMAL HIGH (ref 70–99)

## 2020-03-08 MED ORDER — FLUDEOXYGLUCOSE F - 18 (FDG) INJECTION
13.5300 | Freq: Once | INTRAVENOUS | Status: AC
  Administered 2020-03-08: 13.53 via INTRAVENOUS

## 2020-03-13 DIAGNOSIS — N400 Enlarged prostate without lower urinary tract symptoms: Secondary | ICD-10-CM | POA: Diagnosis not present

## 2020-03-13 DIAGNOSIS — Z1389 Encounter for screening for other disorder: Secondary | ICD-10-CM | POA: Diagnosis not present

## 2020-03-13 DIAGNOSIS — E7800 Pure hypercholesterolemia, unspecified: Secondary | ICD-10-CM | POA: Diagnosis not present

## 2020-03-13 DIAGNOSIS — Z125 Encounter for screening for malignant neoplasm of prostate: Secondary | ICD-10-CM | POA: Diagnosis not present

## 2020-03-13 DIAGNOSIS — M109 Gout, unspecified: Secondary | ICD-10-CM | POA: Diagnosis not present

## 2020-03-13 DIAGNOSIS — I10 Essential (primary) hypertension: Secondary | ICD-10-CM | POA: Diagnosis not present

## 2020-03-13 DIAGNOSIS — R739 Hyperglycemia, unspecified: Secondary | ICD-10-CM | POA: Diagnosis not present

## 2020-03-13 DIAGNOSIS — E041 Nontoxic single thyroid nodule: Secondary | ICD-10-CM | POA: Diagnosis not present

## 2020-03-13 DIAGNOSIS — Z0000 Encounter for general adult medical examination without abnormal findings: Secondary | ICD-10-CM | POA: Diagnosis not present

## 2020-06-05 DIAGNOSIS — H5203 Hypermetropia, bilateral: Secondary | ICD-10-CM | POA: Diagnosis not present

## 2020-06-05 DIAGNOSIS — H52203 Unspecified astigmatism, bilateral: Secondary | ICD-10-CM | POA: Diagnosis not present

## 2020-06-05 DIAGNOSIS — H25813 Combined forms of age-related cataract, bilateral: Secondary | ICD-10-CM | POA: Diagnosis not present

## 2020-06-05 DIAGNOSIS — H524 Presbyopia: Secondary | ICD-10-CM | POA: Diagnosis not present

## 2020-06-16 DIAGNOSIS — H52223 Regular astigmatism, bilateral: Secondary | ICD-10-CM | POA: Diagnosis not present

## 2020-06-16 DIAGNOSIS — H524 Presbyopia: Secondary | ICD-10-CM | POA: Diagnosis not present

## 2020-09-07 DIAGNOSIS — E7800 Pure hypercholesterolemia, unspecified: Secondary | ICD-10-CM | POA: Diagnosis not present

## 2020-09-07 DIAGNOSIS — R7303 Prediabetes: Secondary | ICD-10-CM | POA: Diagnosis not present

## 2020-09-07 DIAGNOSIS — I10 Essential (primary) hypertension: Secondary | ICD-10-CM | POA: Diagnosis not present

## 2020-09-07 DIAGNOSIS — M1990 Unspecified osteoarthritis, unspecified site: Secondary | ICD-10-CM | POA: Diagnosis not present

## 2020-09-07 DIAGNOSIS — R911 Solitary pulmonary nodule: Secondary | ICD-10-CM | POA: Diagnosis not present

## 2020-09-07 DIAGNOSIS — J309 Allergic rhinitis, unspecified: Secondary | ICD-10-CM | POA: Diagnosis not present

## 2020-09-07 DIAGNOSIS — R202 Paresthesia of skin: Secondary | ICD-10-CM | POA: Diagnosis not present

## 2020-09-13 ENCOUNTER — Other Ambulatory Visit: Payer: Self-pay | Admitting: Family Medicine

## 2020-09-13 DIAGNOSIS — IMO0001 Reserved for inherently not codable concepts without codable children: Secondary | ICD-10-CM

## 2020-09-13 DIAGNOSIS — R911 Solitary pulmonary nodule: Secondary | ICD-10-CM

## 2020-10-12 ENCOUNTER — Other Ambulatory Visit: Payer: Self-pay

## 2020-10-12 ENCOUNTER — Ambulatory Visit
Admission: RE | Admit: 2020-10-12 | Discharge: 2020-10-12 | Disposition: A | Discharge status: Home / Self Care | Payer: Medicare HMO | Source: Ambulatory Visit | Attending: Family Medicine | Admitting: Family Medicine

## 2020-10-12 DIAGNOSIS — J9811 Atelectasis: Secondary | ICD-10-CM | POA: Diagnosis not present

## 2020-10-12 DIAGNOSIS — IMO0001 Reserved for inherently not codable concepts without codable children: Secondary | ICD-10-CM

## 2020-10-12 DIAGNOSIS — J984 Other disorders of lung: Secondary | ICD-10-CM | POA: Diagnosis not present

## 2020-10-12 DIAGNOSIS — I2510 Atherosclerotic heart disease of native coronary artery without angina pectoris: Secondary | ICD-10-CM | POA: Diagnosis not present

## 2020-10-12 DIAGNOSIS — Q791 Other congenital malformations of diaphragm: Secondary | ICD-10-CM | POA: Diagnosis not present

## 2021-04-08 DIAGNOSIS — N529 Male erectile dysfunction, unspecified: Secondary | ICD-10-CM | POA: Diagnosis not present

## 2021-04-08 DIAGNOSIS — L409 Psoriasis, unspecified: Secondary | ICD-10-CM | POA: Diagnosis not present

## 2021-04-08 DIAGNOSIS — N400 Enlarged prostate without lower urinary tract symptoms: Secondary | ICD-10-CM | POA: Diagnosis not present

## 2021-04-08 DIAGNOSIS — I700 Atherosclerosis of aorta: Secondary | ICD-10-CM | POA: Diagnosis not present

## 2021-04-08 DIAGNOSIS — Z0000 Encounter for general adult medical examination without abnormal findings: Secondary | ICD-10-CM | POA: Diagnosis not present

## 2021-04-08 DIAGNOSIS — M109 Gout, unspecified: Secondary | ICD-10-CM | POA: Diagnosis not present

## 2021-04-08 DIAGNOSIS — Z1331 Encounter for screening for depression: Secondary | ICD-10-CM | POA: Diagnosis not present

## 2021-04-08 DIAGNOSIS — Z1389 Encounter for screening for other disorder: Secondary | ICD-10-CM | POA: Diagnosis not present

## 2021-04-08 DIAGNOSIS — E7800 Pure hypercholesterolemia, unspecified: Secondary | ICD-10-CM | POA: Diagnosis not present

## 2021-04-08 DIAGNOSIS — M1990 Unspecified osteoarthritis, unspecified site: Secondary | ICD-10-CM | POA: Diagnosis not present

## 2021-04-08 DIAGNOSIS — I10 Essential (primary) hypertension: Secondary | ICD-10-CM | POA: Diagnosis not present

## 2021-04-10 ENCOUNTER — Other Ambulatory Visit: Payer: Self-pay | Admitting: Family Medicine

## 2021-04-10 DIAGNOSIS — R911 Solitary pulmonary nodule: Secondary | ICD-10-CM

## 2021-05-07 ENCOUNTER — Ambulatory Visit
Admission: RE | Admit: 2021-05-07 | Discharge: 2021-05-07 | Disposition: A | Discharge status: Home / Self Care | Payer: Medicare HMO | Source: Ambulatory Visit | Attending: Family Medicine | Admitting: Family Medicine

## 2021-05-07 DIAGNOSIS — I700 Atherosclerosis of aorta: Secondary | ICD-10-CM | POA: Diagnosis not present

## 2021-05-07 DIAGNOSIS — R911 Solitary pulmonary nodule: Secondary | ICD-10-CM

## 2021-06-06 DIAGNOSIS — H52223 Regular astigmatism, bilateral: Secondary | ICD-10-CM | POA: Diagnosis not present

## 2021-06-06 DIAGNOSIS — H52203 Unspecified astigmatism, bilateral: Secondary | ICD-10-CM | POA: Diagnosis not present

## 2021-06-06 DIAGNOSIS — H25813 Combined forms of age-related cataract, bilateral: Secondary | ICD-10-CM | POA: Diagnosis not present

## 2021-06-06 DIAGNOSIS — H5203 Hypermetropia, bilateral: Secondary | ICD-10-CM | POA: Diagnosis not present

## 2021-06-06 DIAGNOSIS — H524 Presbyopia: Secondary | ICD-10-CM | POA: Diagnosis not present

## 2021-06-14 ENCOUNTER — Ambulatory Visit
Admission: EM | Admit: 2021-06-14 | Discharge: 2021-06-14 | Disposition: A | Discharge status: Home / Self Care | Payer: Medicare HMO | Attending: Internal Medicine | Admitting: Internal Medicine

## 2021-06-14 ENCOUNTER — Other Ambulatory Visit: Payer: Self-pay

## 2021-06-14 DIAGNOSIS — I10 Essential (primary) hypertension: Secondary | ICD-10-CM

## 2021-06-14 MED ORDER — AMLODIPINE BESYLATE 5 MG PO TABS: 5.0000 mg | ORAL_TABLET | Freq: Every day | ORAL | 0 refills | Status: AC

## 2021-06-14 NOTE — ED Provider Notes (Signed)
[facility]    CSN: [id number] Arrival date & time: 06/14/21  1141      History   Chief Complaint Chief Complaint  Patient presents with   Hypertension    HPI Horatio Bertz is a 71 y.o. male.   Patient presents for concerns for elevated blood pressure.  He reports that he went to the dentist a few days ago and his blood pressure was high but he is not sure the blood pressure number.  He also reports that he has been waking up with dizziness over the past few mornings.  Dizziness does not last into the day.  Denies any associated headaches, nausea, vomiting, blurred vision, chest pain, shortness of breath.  He reports that he currently takes lisinopril.  Last saw PCP approximately 1 month ago and no medication changes were made.  He reports that his blood pressure was normal at previous PCP visit.   Hypertension   Past Medical History:  Diagnosis Date   Hypertension     Patient Active Problem List   Diagnosis Date Noted   Essential hypertension 08/09/2008    History reviewed. No pertinent surgical history.     Home Medications    Prior to Admission medications   Medication Sig Start Date End Date Taking? Authorizing Provider  amLODipine (NORVASC) 5 MG tablet Take 1 tablet (5 mg total) by mouth daily. 06/14/21  Yes Neriah Brott, Hildred Alamin E, FNP  amoxicillin (AMOXIL) 500 MG capsule Take 1 capsule (500 mg total) by mouth 3 (three) times daily. 02/20/20   Mayers, Cari S, PA-C  atorvastatin (LIPITOR) 20 MG tablet Take 20 mg by mouth daily.    [provider]  lisinopril (ZESTRIL) 20 MG tablet Take 20 mg by mouth daily.    [provider]    Family History Family History  Problem Relation Age of Onset   Hypertension Mother    Diabetes Mother    Healthy Father     Social History Social History   Tobacco Use   Smoking status: Never   Smokeless tobacco: Never  Substance Use Topics   Alcohol use: Yes    Comment: rare   Drug use: No      Allergies   Patient has no known allergies.   Review of Systems Review of Systems Per HPI  Physical Exam Triage Vital Signs ED Triage Vitals  Enc Vitals Group     BP 06/14/21 1248 (!) 168/80     Pulse Rate 06/14/21 1248 66     Resp 06/14/21 1248 19     Temp 06/14/21 1248 98 F (36.7 C)     Temp src --      SpO2 06/14/21 1248 97 %     Weight --      Height --      Head Circumference --      Peak Flow --      Pain Score 06/14/21 1247 0     Pain Loc --      Pain Edu? --      Excl. in [place]? --    No data found.  Updated Vital Signs BP (!) 168/80    Pulse 66    Temp 98 F (36.7 C)    Resp 19    SpO2 97%   Visual Acuity Right Eye Distance:   Left Eye Distance:   Bilateral Distance:    Right Eye Near:   Left Eye Near:    Bilateral Near:     Physical Exam  Constitutional:      General: He is not in acute distress.    Appearance: Normal appearance. He is not toxic-appearing or diaphoretic.  HENT:     Head: Normocephalic and atraumatic.  Eyes:     Extraocular Movements: Extraocular movements intact.     Conjunctiva/sclera: Conjunctivae normal.     Pupils: Pupils are equal, round, and reactive to light.  Cardiovascular:     Rate and Rhythm: Normal rate and regular rhythm.     Pulses: Normal pulses.     Heart sounds: Normal heart sounds.  Pulmonary:     Effort: Pulmonary effort is normal. No respiratory distress.     Breath sounds: Normal breath sounds.  Neurological:     General: No focal deficit present.     Mental Status: He is alert and oriented to person, place, and time. Mental status is at baseline.     Cranial Nerves: Cranial nerves 2-12 are intact.     Sensory: Sensation is intact.     Motor: Motor function is intact.     Coordination: Coordination is intact.     Gait: Gait is intact.  Psychiatric:        Mood and Affect: Mood normal.        Behavior: Behavior normal.        Thought Content: Thought content normal.        Judgment: Judgment  normal.     UC Treatments / Results  Labs (all labs ordered are listed, but only abnormal results are displayed) Labs Reviewed - No data to display  EKG   Radiology No results found.  Procedures Procedures (including critical care time)  Medications Ordered in UC Medications - No data to display  Initial Impression / Assessment and Plan / UC Course  I have reviewed the triage vital signs and the nursing notes.  Pertinent labs & imaging results that were available during my care of the patient were reviewed by me and considered in my medical decision making (see chart for details).    Patient's blood pressure is not at goal.  He has also been having some associated dizziness.  There are no signs of hypertensive urgency as patient's neuro exam is normal and no signs of endorgan damage.  Will opt to add amlodipine to lisinopril regimen.  Patient to monitor blood pressure at home closely and to follow-up with PCP in the next few days for further evaluation and management.  Advised patient to go the hospital if dizziness worsens or if new symptoms develop.  Discussed strict return precautions.  Patient verbalized understanding was agreeable with plan.  Final Clinical Impressions(s) / UC Diagnoses   Final diagnoses:  Essential hypertension     Discharge Instructions      A blood pressure medication has been added to your regimen.  Please monitor your blood pressure at least twice daily.  Follow-up with primary care doctor today to schedule an appointment for further evaluation and management.    ED Prescriptions     Medication Sig Dispense Auth. Provider   amLODipine (NORVASC) 5 MG tablet Take 1 tablet (5 mg total) by mouth daily. 30 tablet [place], Michele Rockers, [place]      PDMP not reviewed this encounter.   Teodora Medici, [place] 06/14/21 1343

## 2021-06-14 NOTE — Discharge Instructions (Signed)
A blood pressure medication has been added to your regimen.  Please monitor your blood pressure at least twice daily.  Follow-up with primary care doctor today to schedule an appointment for further evaluation and management.

## 2021-06-14 NOTE — ED Triage Notes (Signed)
Pt presents with concern for elevated blood pressure. Reports taking bp medication as prescribed. Reports no symptoms right now but he does feel dizzy in the morning.

## 2021-10-07 DIAGNOSIS — I10 Essential (primary) hypertension: Secondary | ICD-10-CM | POA: Diagnosis not present

## 2021-10-07 DIAGNOSIS — I700 Atherosclerosis of aorta: Secondary | ICD-10-CM | POA: Diagnosis not present

## 2021-10-07 DIAGNOSIS — E7800 Pure hypercholesterolemia, unspecified: Secondary | ICD-10-CM | POA: Diagnosis not present

## 2021-10-07 DIAGNOSIS — R7303 Prediabetes: Secondary | ICD-10-CM | POA: Diagnosis not present

## 2021-12-06 DIAGNOSIS — K635 Polyp of colon: Secondary | ICD-10-CM | POA: Diagnosis not present

## 2021-12-06 DIAGNOSIS — Z86010 Personal history of colonic polyps: Secondary | ICD-10-CM | POA: Diagnosis not present

## 2021-12-06 DIAGNOSIS — K5730 Diverticulosis of large intestine without perforation or abscess without bleeding: Secondary | ICD-10-CM | POA: Diagnosis not present

## 2021-12-06 DIAGNOSIS — Z09 Encounter for follow-up examination after completed treatment for conditions other than malignant neoplasm: Secondary | ICD-10-CM | POA: Diagnosis not present

## 2021-12-06 DIAGNOSIS — K649 Unspecified hemorrhoids: Secondary | ICD-10-CM | POA: Diagnosis not present

## 2021-12-10 DIAGNOSIS — K635 Polyp of colon: Secondary | ICD-10-CM | POA: Diagnosis not present

## 2021-12-13 DIAGNOSIS — H6590 Unspecified nonsuppurative otitis media, unspecified ear: Secondary | ICD-10-CM | POA: Diagnosis not present

## 2021-12-13 DIAGNOSIS — G5603 Carpal tunnel syndrome, bilateral upper limbs: Secondary | ICD-10-CM | POA: Diagnosis not present

## 2022-02-28 IMAGING — DX DG CHEST 1V PORT
1 series · 1 of 1 positions shown · non-contrast
Comparison: None.

CLINICAL DATA: Dizziness and lethargy.  Shortness of breath.

EXAM:
PORTABLE CHEST 1 VIEW

[chest]
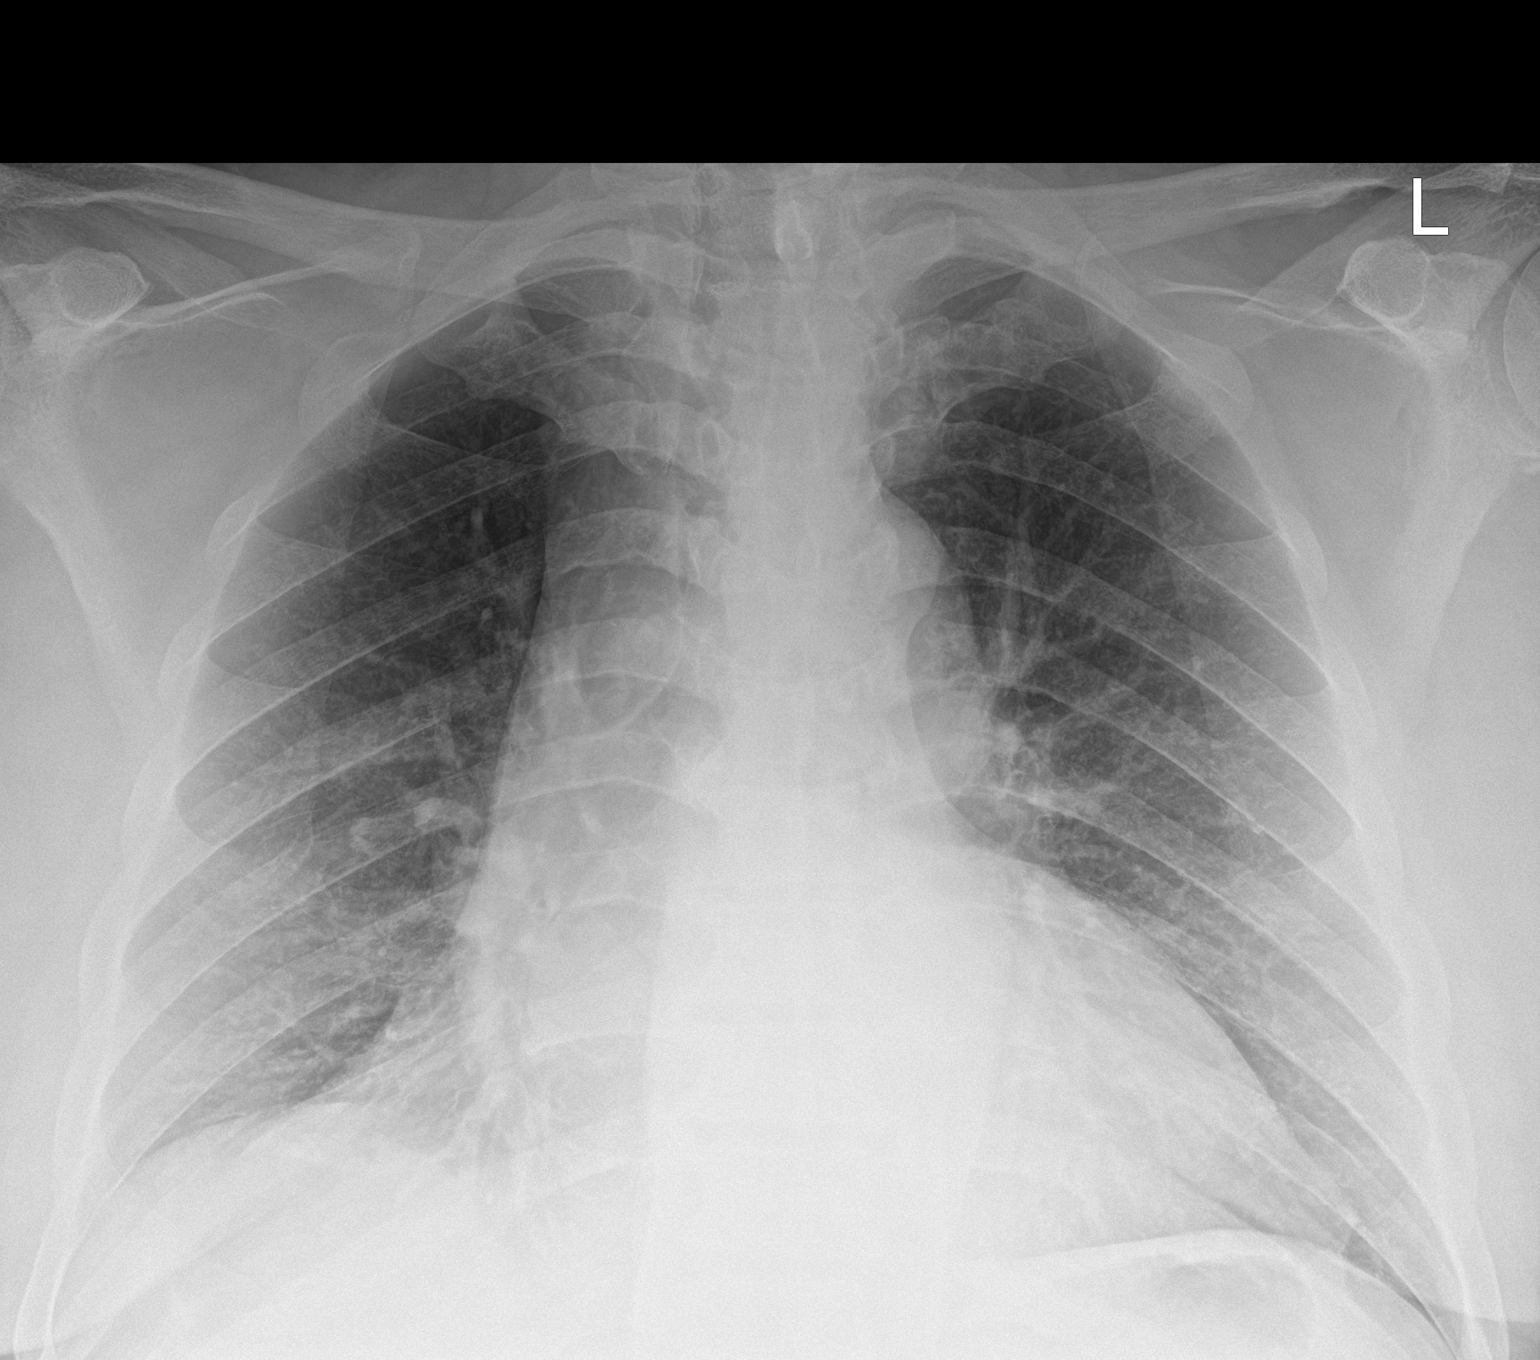

[1 of 1 positions shown; findings below may reference images not displayed]

FINDINGS: Lungs are adequately inflated without focal airspace consolidation
or effusion. Borderline cardiomegaly. Degenerative change of the
spine.
IMPRESSION: No active disease.

## 2022-02-28 IMAGING — CT CT HEAD W/O CM
4 series · 16 of 47 positions shown, 18 images · non-contrast
Comparison: None.

CLINICAL DATA: 68-year-old male with a history of dizziness and
lightheadedness

EXAM:
CT HEAD WITHOUT CONTRAST
TECHNIQUE: Contiguous axial images were obtained from the base of the skull
through the vertex without intravenous contrast.

[Series 3: head without · axial · non-contrast · 0.47mm/px · z∈[-93,+27]mm · 7 of 34 slices shown, 9 images]
[im 5/34  brain]
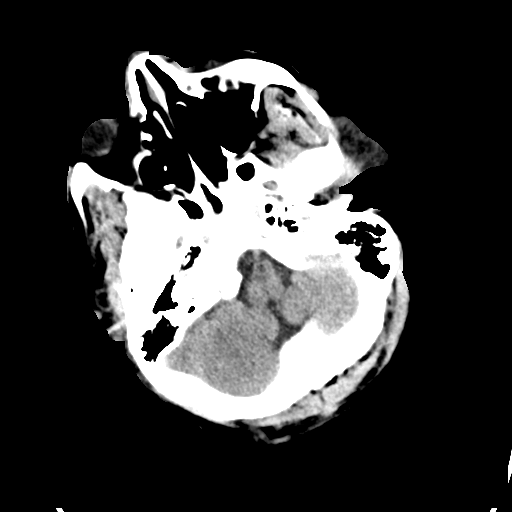
[im 5/34  bone]
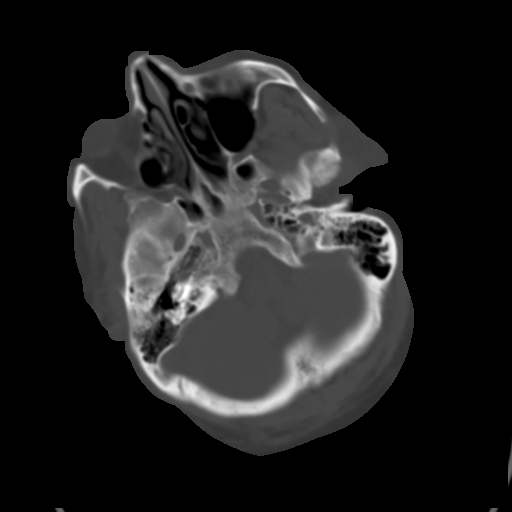
[im 9/34  brain]
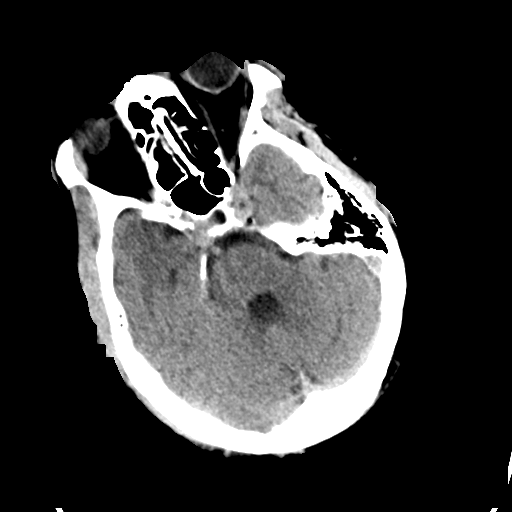
[im 13/34  brain]
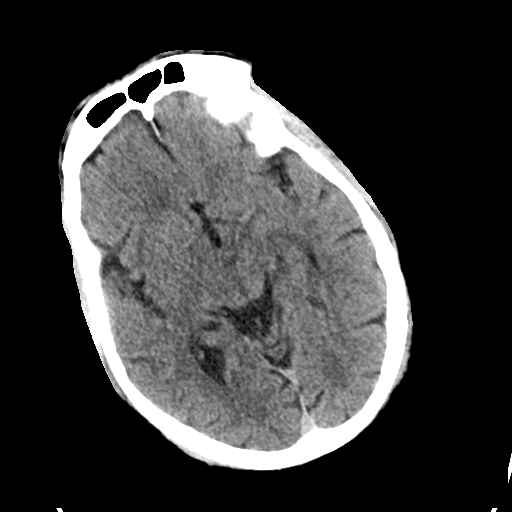
[im 17/34  brain]
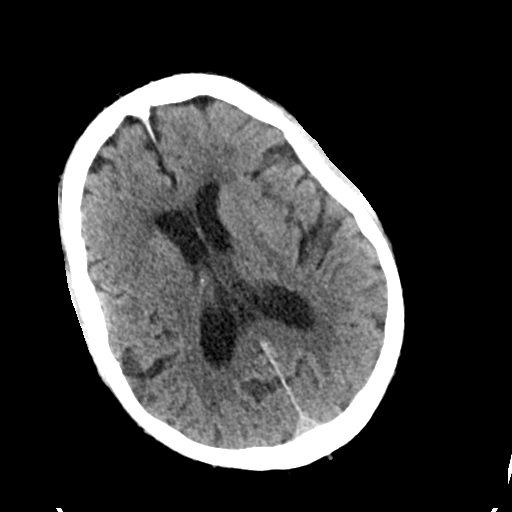
[im 21/34  brain]
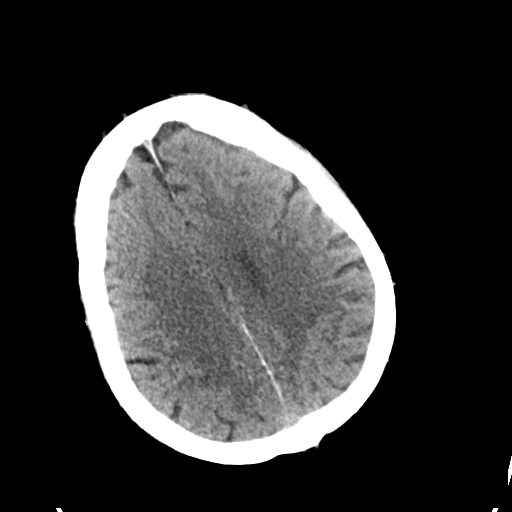
[im 21/34  bone]
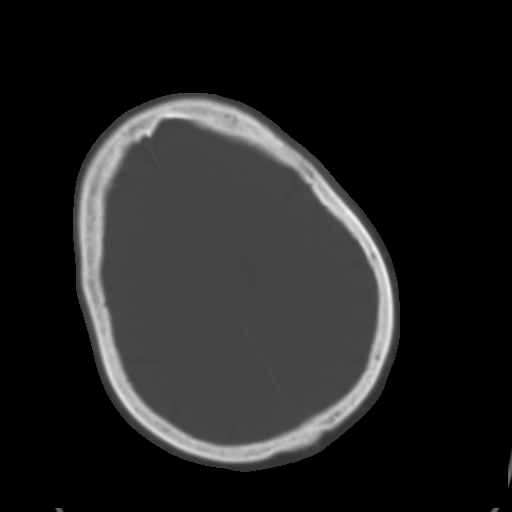
[im 25/34  brain]
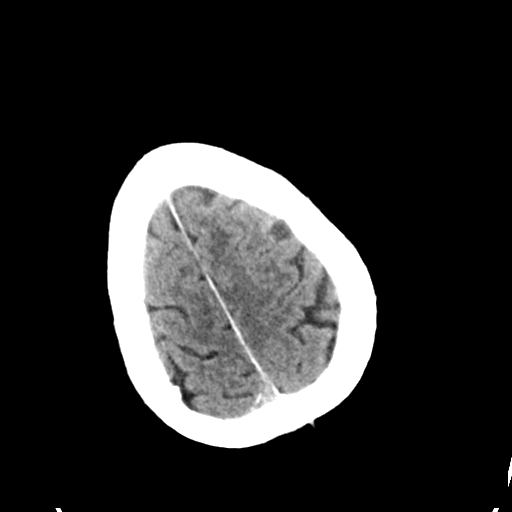
[im 29/34  brain]
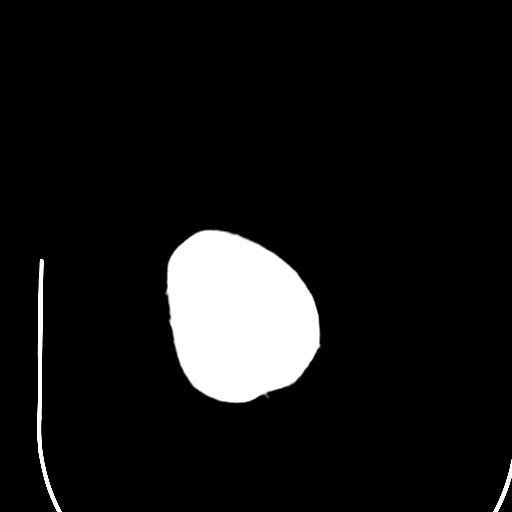

[Series 4: head bone · axial · 0.47mm/px · z∈[-97,-65]mm · 3 of 84 slices shown]
[im 9/84  bone]
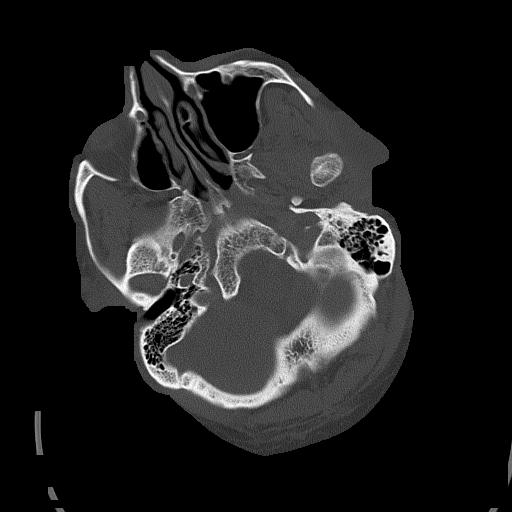
[im 17/84  bone]
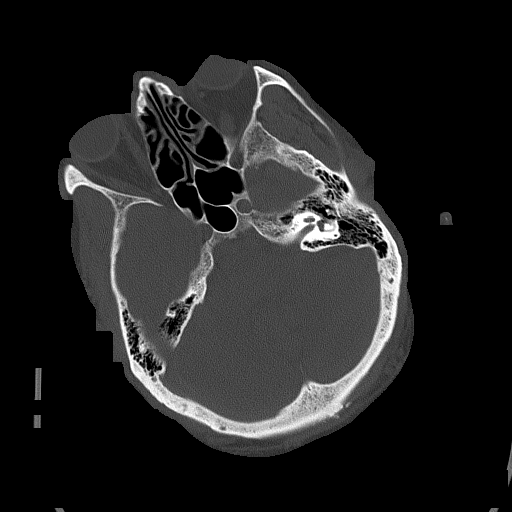
[im 25/84  bone]
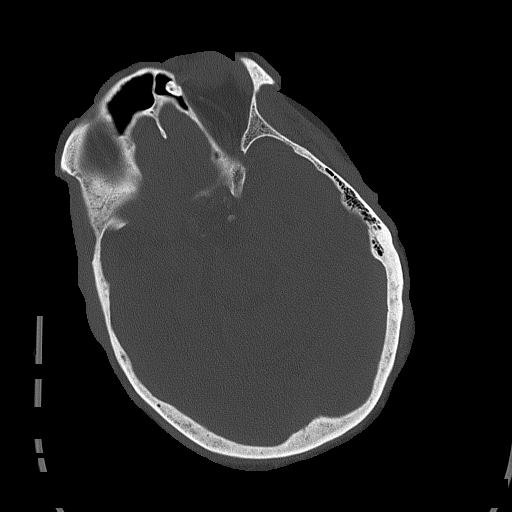

[Series 5: head without cor · coronal · non-contrast · 0.30mm/px · 3 of 72 slices shown]
[im 24/72  brain]
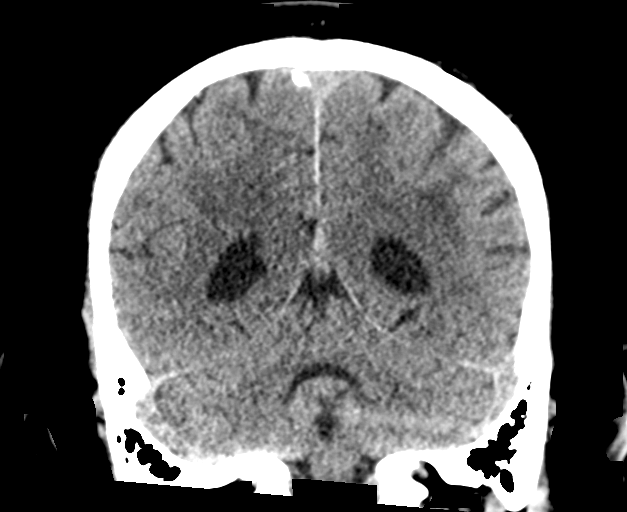
[im 32/72  brain]
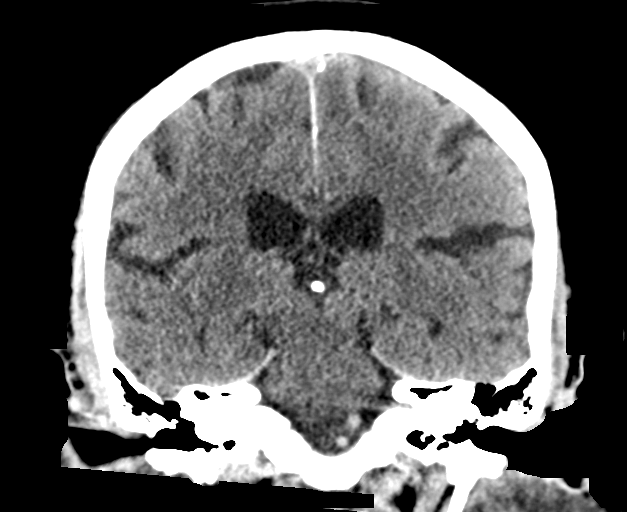
[im 40/72  brain]
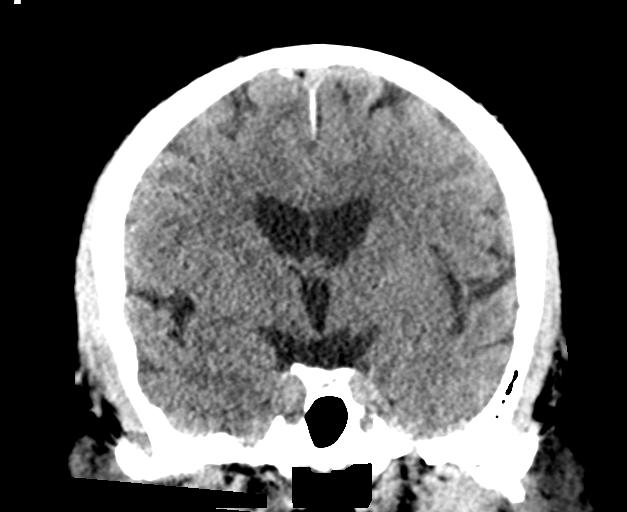

[Series 6: head without sag · sagittal · non-contrast · 0.32mm/px · 3 of 67 slices shown]
[im 28/67  brain]
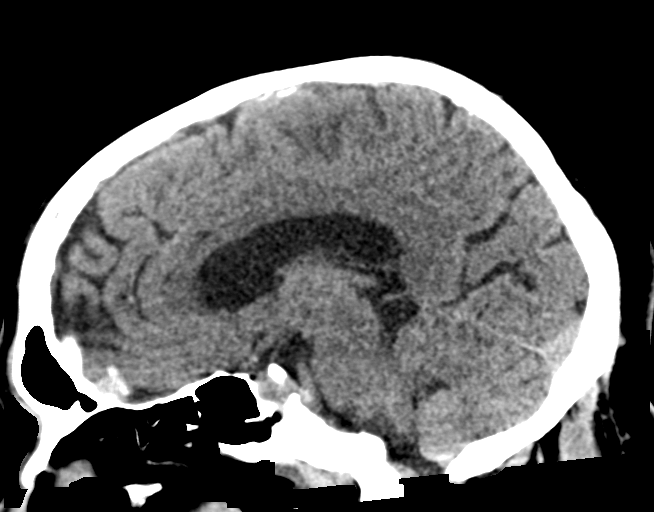
[im 34/67  brain]
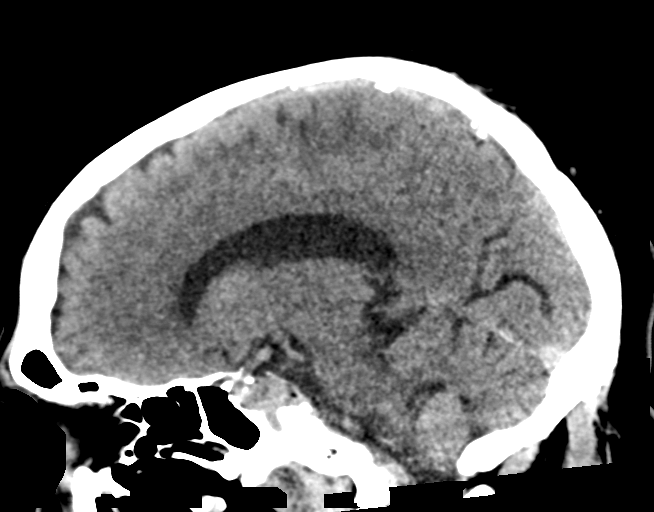
[im 39/67  brain]
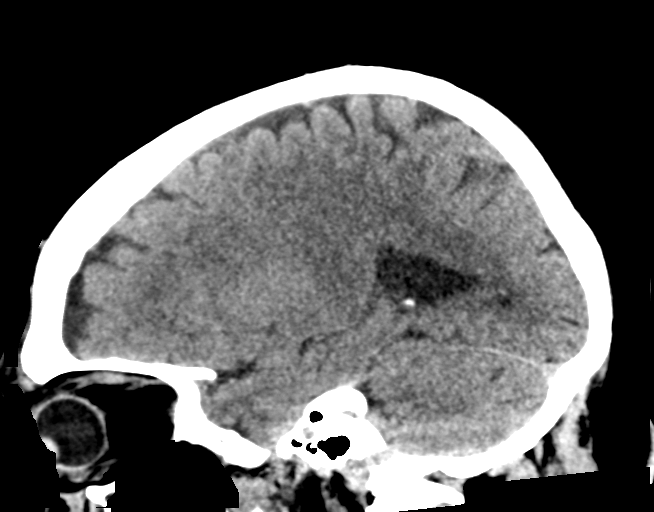

[16 of 47 positions shown; findings below may reference images not displayed]

FINDINGS: Brain: No acute intracranial hemorrhage. No midline shift or mass
effect. Gray-white differentiation maintained. Unremarkable
appearance of the ventricular system. Patchy hypodensity in the
bilateral periventricular white matter.

Vascular: Intracranial atherosclerosis.

Skull: No acute fracture.  No aggressive bone lesion identified.

Sinuses/Orbits: Unremarkable appearance of the orbits. Mastoid air
cells clear. No middle ear effusion. No significant sinus disease.

Other: None
IMPRESSION: Negative for acute intracranial abnormality.

Chronic microvascular ischemic disease and intracranial
atherosclerosis.

## 2022-04-09 DIAGNOSIS — L409 Psoriasis, unspecified: Secondary | ICD-10-CM | POA: Diagnosis not present

## 2022-04-09 DIAGNOSIS — E7800 Pure hypercholesterolemia, unspecified: Secondary | ICD-10-CM | POA: Diagnosis not present

## 2022-04-09 DIAGNOSIS — M109 Gout, unspecified: Secondary | ICD-10-CM | POA: Diagnosis not present

## 2022-04-09 DIAGNOSIS — E041 Nontoxic single thyroid nodule: Secondary | ICD-10-CM | POA: Diagnosis not present

## 2022-04-09 DIAGNOSIS — I10 Essential (primary) hypertension: Secondary | ICD-10-CM | POA: Diagnosis not present

## 2022-04-09 DIAGNOSIS — N400 Enlarged prostate without lower urinary tract symptoms: Secondary | ICD-10-CM | POA: Diagnosis not present

## 2022-04-09 DIAGNOSIS — R7303 Prediabetes: Secondary | ICD-10-CM | POA: Diagnosis not present

## 2022-04-09 DIAGNOSIS — Z0000 Encounter for general adult medical examination without abnormal findings: Secondary | ICD-10-CM | POA: Diagnosis not present

## 2022-04-09 DIAGNOSIS — I700 Atherosclerosis of aorta: Secondary | ICD-10-CM | POA: Diagnosis not present

## 2022-04-09 DIAGNOSIS — M1990 Unspecified osteoarthritis, unspecified site: Secondary | ICD-10-CM | POA: Diagnosis not present

## 2022-05-07 DIAGNOSIS — G5603 Carpal tunnel syndrome, bilateral upper limbs: Secondary | ICD-10-CM | POA: Diagnosis not present

## 2022-05-12 DIAGNOSIS — I10 Essential (primary) hypertension: Secondary | ICD-10-CM | POA: Diagnosis not present

## 2022-05-12 DIAGNOSIS — L600 Ingrowing nail: Secondary | ICD-10-CM | POA: Diagnosis not present

## 2022-06-03 DIAGNOSIS — G5602 Carpal tunnel syndrome, left upper limb: Secondary | ICD-10-CM | POA: Diagnosis not present

## 2022-06-03 DIAGNOSIS — G5601 Carpal tunnel syndrome, right upper limb: Secondary | ICD-10-CM | POA: Diagnosis not present

## 2022-06-03 DIAGNOSIS — G5603 Carpal tunnel syndrome, bilateral upper limbs: Secondary | ICD-10-CM | POA: Diagnosis not present

## 2022-06-05 IMAGING — CT CT CHEST W/ CM
1 series · 15 of 34 positions shown, 19 images · IV contrast (iopamidol)
Comparison: Radiographs 10/22/2019 and 11/25/2019.  CTA 10/22/2019.

CLINICAL DATA: Follow up right basilar infiltrate. Possible
pneumonia. No current complaints. No history of malignancy or prior
relevant surgery.

EXAM:
CT CHEST WITH CONTRAST
TECHNIQUE: Multidetector CT imaging of the chest was performed during
intravenous contrast administration.
CONTRAST:  75mL 19POOP-DEE IOPAMIDOL (19POOP-DEE) INJECTION 61%

[Series 2: chest w/cm · axial · 0.88mm/px · z∈[-352,-68]mm · 15 of 168 slices shown, 19 images]
[im 13/168  mediastinal]
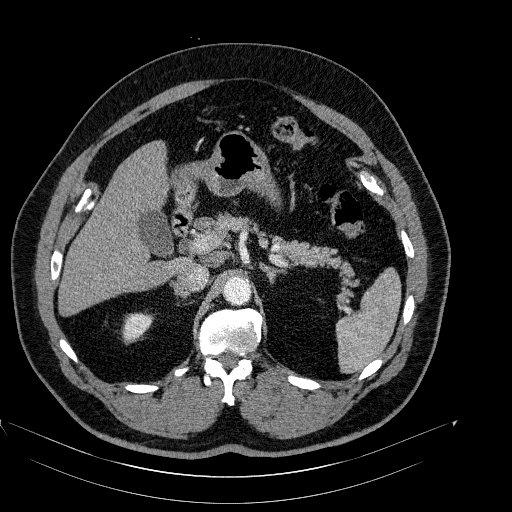
[im 13/168  lung]
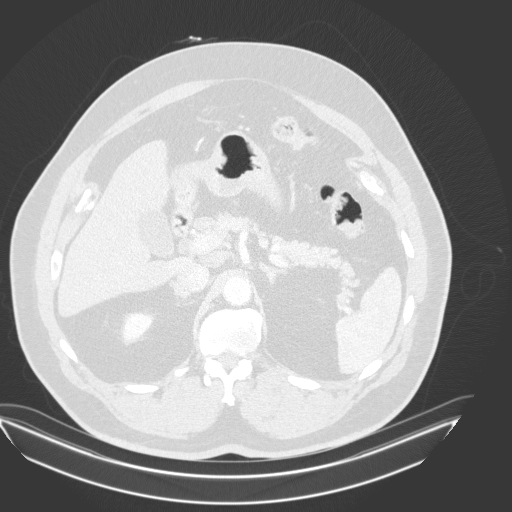
[im 25/168  lung]
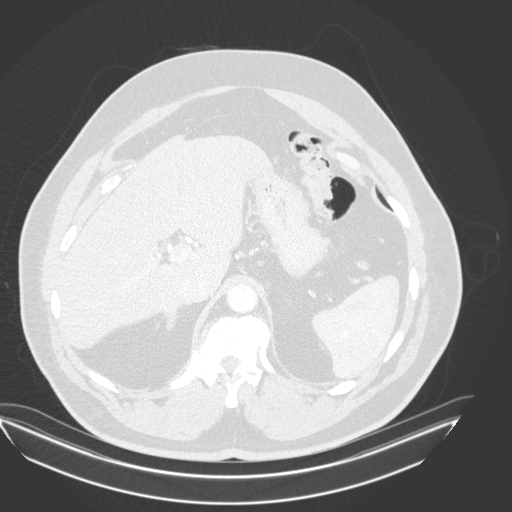
[im 34/168  lung]
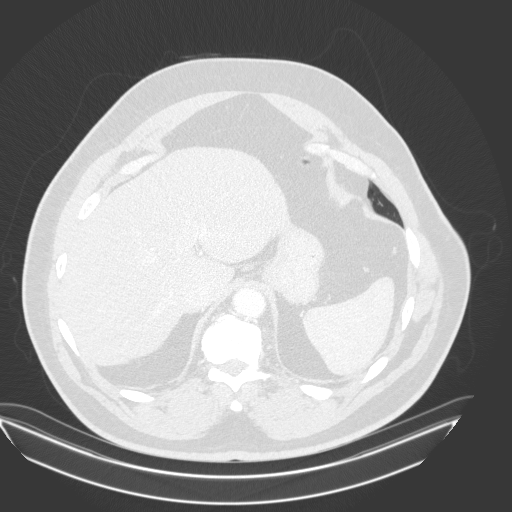
[im 44/168  lung]
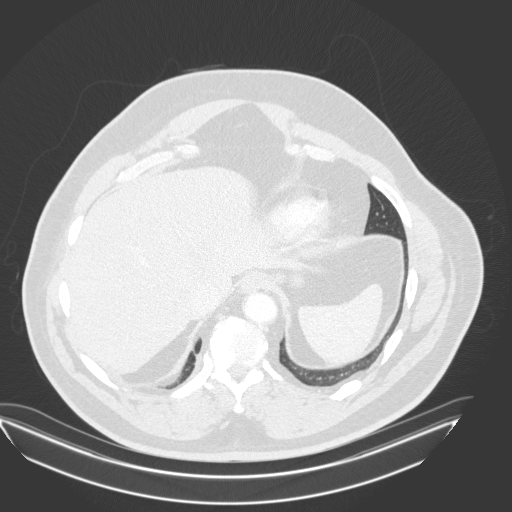
[im 56/168  mediastinal]
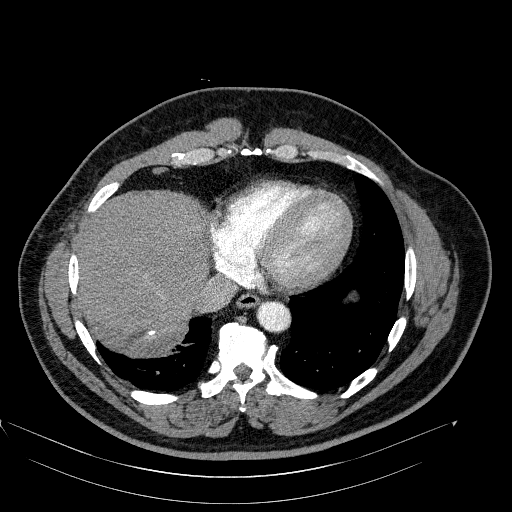
[im 56/168  lung]
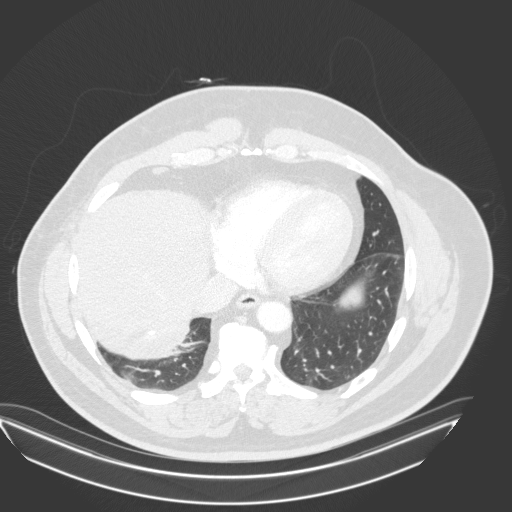
[im 67/168  lung]
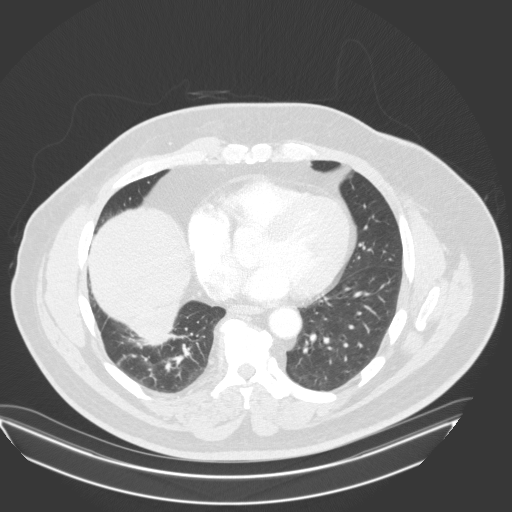
[im 75/168  lung]
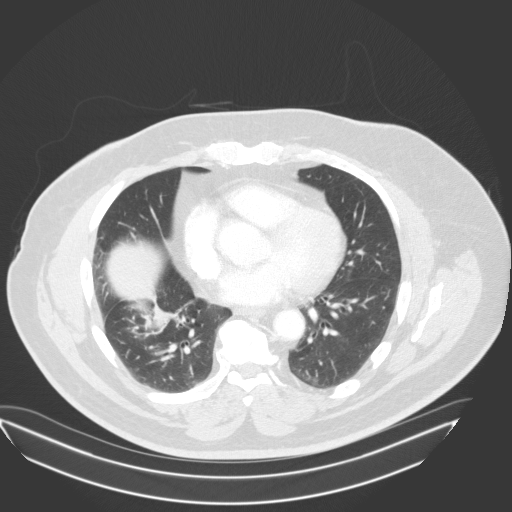
[im 87/168  lung]
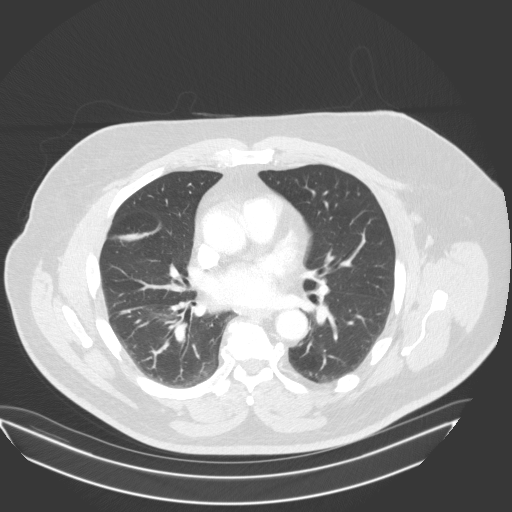
[im 93/168  mediastinal]
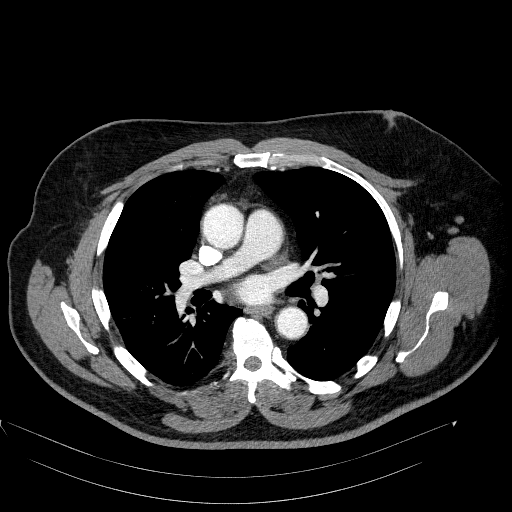
[im 93/168  lung]
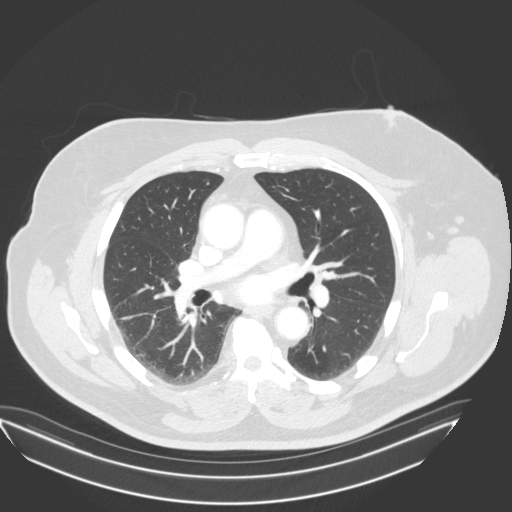
[im 101/168  lung]
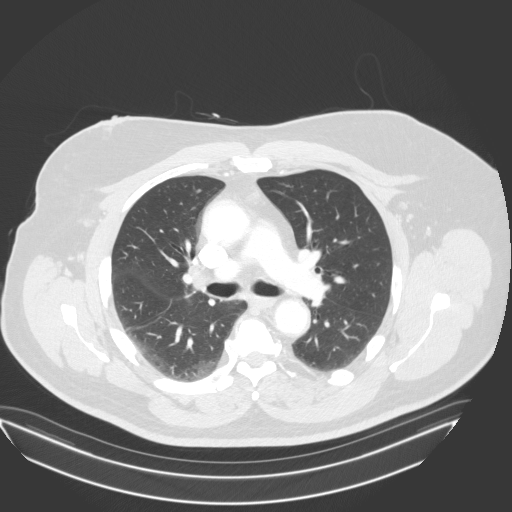
[im 112/168  lung]
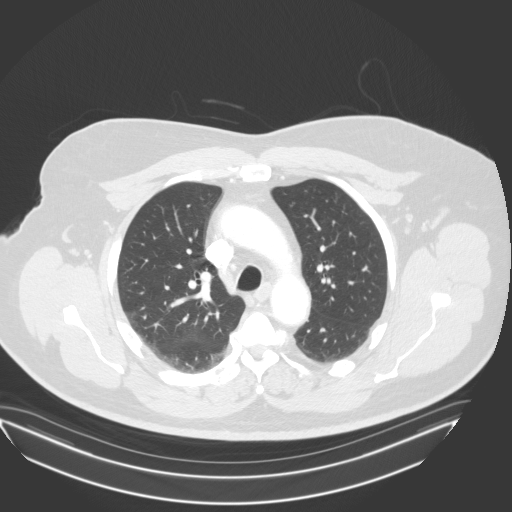
[im 124/168  lung]
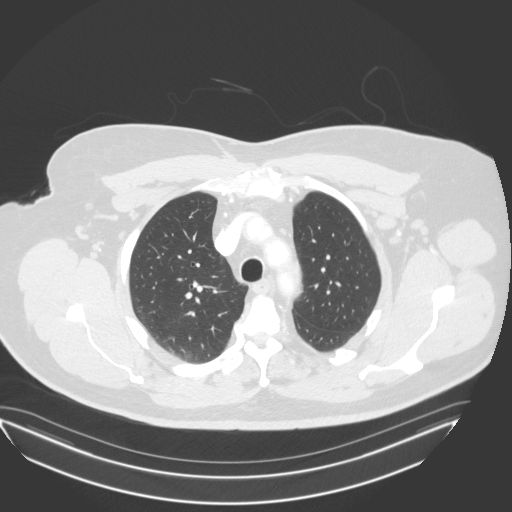
[im 134/168  mediastinal]
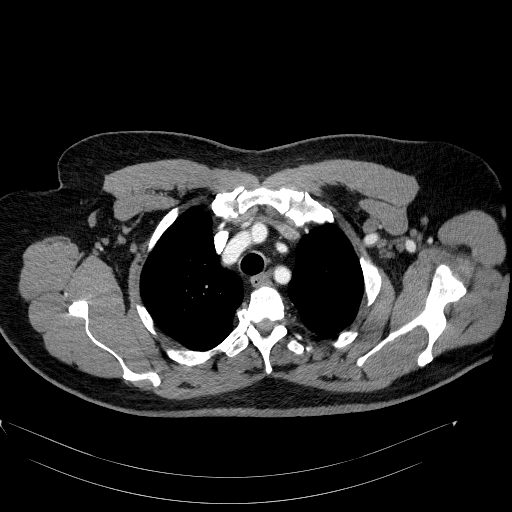
[im 134/168  lung]
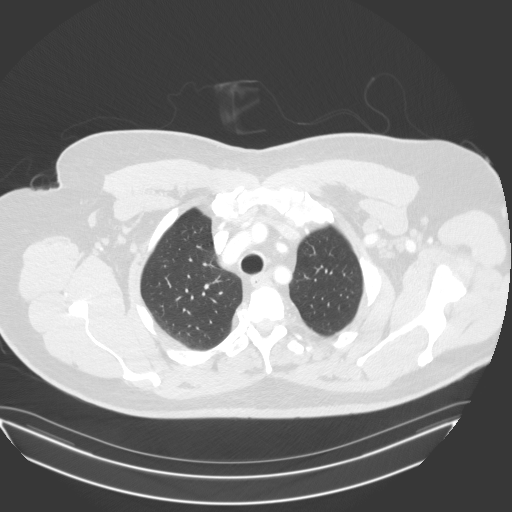
[im 143/168  lung]
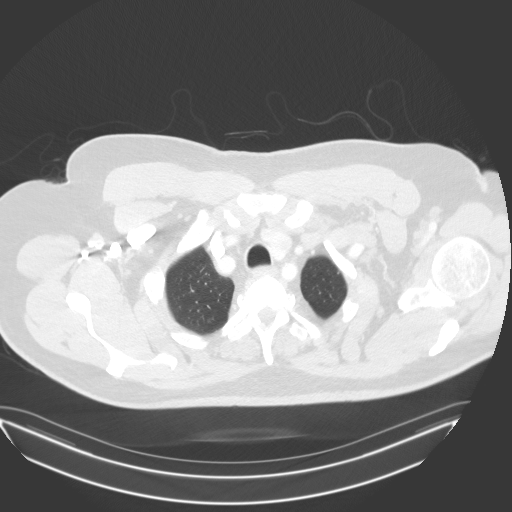
[im 155/168  lung]
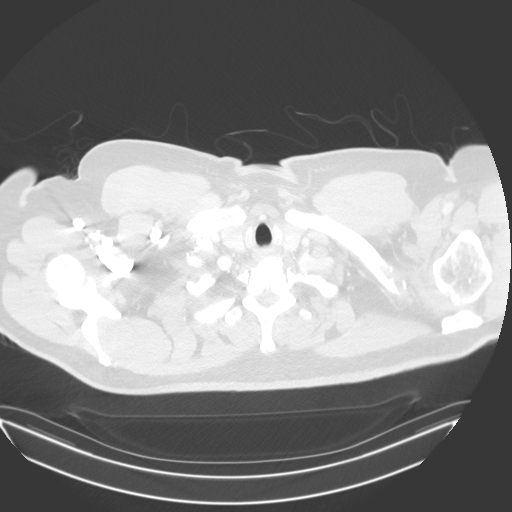

[15 of 34 positions shown; findings below may reference images not displayed]

FINDINGS: Cardiovascular: No acute vascular findings. Stable mild aortic
atherosclerosis. The heart size is normal. There is no pericardial
effusion.

Mediastinum/Nodes: There are no enlarged mediastinal, hilar or
axillary lymph nodes.Homogeneous low-density left thyroid nodule
measures up to 2.0 cm on image [DATE]. The trachea and esophagus
demonstrate no significant findings.

Lungs/Pleura: There is no pleural effusion or pneumothorax. The
right hemidiaphragm demonstrates eventration anteriorly. Pleural
based process posteriorly along the right hemidiaphragm is unchanged
from the previous CT. There is linear calcification within the right
hemidiaphragm which appears thickened and mildly inverted. Adjacent
subpleural mass or rounded atelectasis is unchanged, measuring
approximately 8.3 x 2.8 x 5.4 cm. Stable additional linear right
middle and lower lobe atelectasis or scarring. The overall pulmonary
aeration has improved. No new or enlarging pulmonary nodules.

Upper abdomen: Subjective mild steatosis without acute findings. The
adrenal glands appear normal.

Musculoskeletal/Chest wall: There is no chest wall mass or
suspicious osseous finding. Lower lumbar spondylosis and scattered
thoracic paraspinal osteophytes.
IMPRESSION: 1. Stable right lower lobe subpleural process along the posterior
aspect of the right hemidiaphragm compared with prior CT of 3 months
ago. Appearance favors rounded atelectasis or postinflammatory
scarring. Neoplasm is less likely. Management options include PET-CT
to exclude hypermetabolic activity in this area or continued CT
follow-up in approximately 6 months.
2. No new or enlarging pulmonary nodules.  No acute chest findings.
3. 2.0 cm left thyroid nodule. Recommend thyroid US (ref: [HOSPITAL]. [DATE]): 143-50).
4. Aortic Atherosclerosis ([id number]).

## 2022-07-10 DIAGNOSIS — G5603 Carpal tunnel syndrome, bilateral upper limbs: Secondary | ICD-10-CM | POA: Diagnosis not present

## 2022-08-05 DIAGNOSIS — H5203 Hypermetropia, bilateral: Secondary | ICD-10-CM | POA: Diagnosis not present

## 2022-08-05 DIAGNOSIS — H524 Presbyopia: Secondary | ICD-10-CM | POA: Diagnosis not present

## 2022-08-05 DIAGNOSIS — H52203 Unspecified astigmatism, bilateral: Secondary | ICD-10-CM | POA: Diagnosis not present

## 2022-08-08 DIAGNOSIS — H524 Presbyopia: Secondary | ICD-10-CM | POA: Diagnosis not present

## 2022-08-08 DIAGNOSIS — H52209 Unspecified astigmatism, unspecified eye: Secondary | ICD-10-CM | POA: Diagnosis not present

## 2022-08-08 DIAGNOSIS — H5203 Hypermetropia, bilateral: Secondary | ICD-10-CM | POA: Diagnosis not present

## 2022-08-13 DIAGNOSIS — G5601 Carpal tunnel syndrome, right upper limb: Secondary | ICD-10-CM | POA: Diagnosis not present

## 2022-08-28 DIAGNOSIS — G5603 Carpal tunnel syndrome, bilateral upper limbs: Secondary | ICD-10-CM | POA: Diagnosis not present

## 2022-10-10 DIAGNOSIS — E7800 Pure hypercholesterolemia, unspecified: Secondary | ICD-10-CM | POA: Diagnosis not present

## 2022-10-10 DIAGNOSIS — M109 Gout, unspecified: Secondary | ICD-10-CM | POA: Diagnosis not present

## 2022-10-10 DIAGNOSIS — L409 Psoriasis, unspecified: Secondary | ICD-10-CM | POA: Diagnosis not present

## 2022-10-10 DIAGNOSIS — I700 Atherosclerosis of aorta: Secondary | ICD-10-CM | POA: Diagnosis not present

## 2022-10-10 DIAGNOSIS — G5601 Carpal tunnel syndrome, right upper limb: Secondary | ICD-10-CM | POA: Diagnosis not present

## 2022-10-10 DIAGNOSIS — M1990 Unspecified osteoarthritis, unspecified site: Secondary | ICD-10-CM | POA: Diagnosis not present

## 2022-10-10 DIAGNOSIS — N400 Enlarged prostate without lower urinary tract symptoms: Secondary | ICD-10-CM | POA: Diagnosis not present

## 2022-10-10 DIAGNOSIS — I10 Essential (primary) hypertension: Secondary | ICD-10-CM | POA: Diagnosis not present

## 2022-10-10 DIAGNOSIS — R7303 Prediabetes: Secondary | ICD-10-CM | POA: Diagnosis not present

## 2022-10-16 DIAGNOSIS — G5603 Carpal tunnel syndrome, bilateral upper limbs: Secondary | ICD-10-CM | POA: Diagnosis not present

## 2023-02-14 DIAGNOSIS — Z23 Encounter for immunization: Secondary | ICD-10-CM | POA: Diagnosis not present

## 2023-04-24 DIAGNOSIS — E041 Nontoxic single thyroid nodule: Secondary | ICD-10-CM | POA: Diagnosis not present

## 2023-04-24 DIAGNOSIS — Z0000 Encounter for general adult medical examination without abnormal findings: Secondary | ICD-10-CM | POA: Diagnosis not present

## 2023-04-24 DIAGNOSIS — M109 Gout, unspecified: Secondary | ICD-10-CM | POA: Diagnosis not present

## 2023-04-24 DIAGNOSIS — R7303 Prediabetes: Secondary | ICD-10-CM | POA: Diagnosis not present

## 2023-04-24 DIAGNOSIS — I10 Essential (primary) hypertension: Secondary | ICD-10-CM | POA: Diagnosis not present

## 2023-04-24 DIAGNOSIS — I700 Atherosclerosis of aorta: Secondary | ICD-10-CM | POA: Diagnosis not present

## 2023-04-24 DIAGNOSIS — L409 Psoriasis, unspecified: Secondary | ICD-10-CM | POA: Diagnosis not present

## 2023-04-24 DIAGNOSIS — E7800 Pure hypercholesterolemia, unspecified: Secondary | ICD-10-CM | POA: Diagnosis not present

## 2023-04-24 DIAGNOSIS — M1990 Unspecified osteoarthritis, unspecified site: Secondary | ICD-10-CM | POA: Diagnosis not present

## 2023-06-02 DIAGNOSIS — H524 Presbyopia: Secondary | ICD-10-CM | POA: Diagnosis not present

## 2023-06-02 DIAGNOSIS — H52209 Unspecified astigmatism, unspecified eye: Secondary | ICD-10-CM | POA: Diagnosis not present

## 2023-06-02 DIAGNOSIS — H5203 Hypermetropia, bilateral: Secondary | ICD-10-CM | POA: Diagnosis not present

## 2023-09-05 DIAGNOSIS — H5203 Hypermetropia, bilateral: Secondary | ICD-10-CM | POA: Diagnosis not present

## 2023-09-05 DIAGNOSIS — H52209 Unspecified astigmatism, unspecified eye: Secondary | ICD-10-CM | POA: Diagnosis not present

## 2023-09-18 ENCOUNTER — Encounter: Payer: Self-pay | Admitting: Family Medicine

## 2023-09-18 ENCOUNTER — Other Ambulatory Visit: Payer: Self-pay | Admitting: Family Medicine

## 2023-09-18 DIAGNOSIS — M79669 Pain in unspecified lower leg: Secondary | ICD-10-CM

## 2023-09-18 DIAGNOSIS — I739 Peripheral vascular disease, unspecified: Secondary | ICD-10-CM

## 2023-09-18 DIAGNOSIS — M79662 Pain in left lower leg: Secondary | ICD-10-CM | POA: Diagnosis not present

## 2023-09-21 ENCOUNTER — Ambulatory Visit
Admission: RE | Admit: 2023-09-21 | Discharge: 2023-09-21 | Disposition: A | Discharge status: Home / Self Care | Source: Ambulatory Visit | Attending: Family Medicine | Admitting: Family Medicine

## 2023-09-21 DIAGNOSIS — I739 Peripheral vascular disease, unspecified: Secondary | ICD-10-CM

## 2023-09-21 DIAGNOSIS — M79662 Pain in left lower leg: Secondary | ICD-10-CM | POA: Diagnosis not present

## 2023-09-21 DIAGNOSIS — Z87891 Personal history of nicotine dependence: Secondary | ICD-10-CM | POA: Diagnosis not present

## 2023-09-21 DIAGNOSIS — M79669 Pain in unspecified lower leg: Secondary | ICD-10-CM

## 2023-11-13 DIAGNOSIS — R7303 Prediabetes: Secondary | ICD-10-CM | POA: Diagnosis not present

## 2023-11-13 DIAGNOSIS — M1990 Unspecified osteoarthritis, unspecified site: Secondary | ICD-10-CM | POA: Diagnosis not present

## 2023-11-13 DIAGNOSIS — M79669 Pain in unspecified lower leg: Secondary | ICD-10-CM | POA: Diagnosis not present

## 2023-11-13 DIAGNOSIS — I10 Essential (primary) hypertension: Secondary | ICD-10-CM | POA: Diagnosis not present

## 2023-11-13 DIAGNOSIS — J309 Allergic rhinitis, unspecified: Secondary | ICD-10-CM | POA: Diagnosis not present

## 2023-11-13 DIAGNOSIS — L409 Psoriasis, unspecified: Secondary | ICD-10-CM | POA: Diagnosis not present

## 2023-11-13 DIAGNOSIS — E7800 Pure hypercholesterolemia, unspecified: Secondary | ICD-10-CM | POA: Diagnosis not present

## 2023-12-03 DIAGNOSIS — M25562 Pain in left knee: Secondary | ICD-10-CM | POA: Diagnosis not present

## 2023-12-03 DIAGNOSIS — G8929 Other chronic pain: Secondary | ICD-10-CM | POA: Diagnosis not present

## 2023-12-31 DIAGNOSIS — M25562 Pain in left knee: Secondary | ICD-10-CM | POA: Diagnosis not present

## 2023-12-31 DIAGNOSIS — M7989 Other specified soft tissue disorders: Secondary | ICD-10-CM | POA: Diagnosis not present

## 2024-01-22 DIAGNOSIS — D72820 Lymphocytosis (symptomatic): Secondary | ICD-10-CM | POA: Diagnosis not present

## 2024-01-22 DIAGNOSIS — I10 Essential (primary) hypertension: Secondary | ICD-10-CM | POA: Diagnosis not present

## 2024-01-22 DIAGNOSIS — E7800 Pure hypercholesterolemia, unspecified: Secondary | ICD-10-CM | POA: Diagnosis not present

## 2024-03-10 DIAGNOSIS — M25562 Pain in left knee: Secondary | ICD-10-CM | POA: Diagnosis not present

## 2024-03-25 DIAGNOSIS — E7800 Pure hypercholesterolemia, unspecified: Secondary | ICD-10-CM | POA: Diagnosis not present

## 2024-03-25 DIAGNOSIS — D72820 Lymphocytosis (symptomatic): Secondary | ICD-10-CM | POA: Diagnosis not present
